# Patient Record
Sex: Male | Born: 1970 | Race: Black or African American | Hispanic: No | Marital: Single | Smoking: Current every day smoker
Health system: Southern US, Community
[De-identification: ages and names within clinical notes are randomized; demographics above are authoritative.]

## PROBLEM LIST (undated history)

## (undated) DIAGNOSIS — I10 Essential (primary) hypertension: Secondary | ICD-10-CM

---

## 2021-07-20 ENCOUNTER — Emergency Department (HOSPITAL_COMMUNITY): Payer: Self-pay

## 2021-07-20 ENCOUNTER — Encounter (HOSPITAL_COMMUNITY): Payer: Self-pay | Admitting: Emergency Medicine

## 2021-07-20 ENCOUNTER — Inpatient Hospital Stay (HOSPITAL_COMMUNITY)
Admission: EM | Admit: 2021-07-20 | Discharge: 2021-07-27 | DRG: 417 | Disposition: A | Discharge status: Home / Self Care | Payer: Self-pay | Attending: Family Medicine | Admitting: Family Medicine

## 2021-07-20 DIAGNOSIS — D573 Sickle-cell trait: Secondary | ICD-10-CM | POA: Diagnosis present

## 2021-07-20 DIAGNOSIS — D571 Sickle-cell disease without crisis: Secondary | ICD-10-CM | POA: Diagnosis present

## 2021-07-20 DIAGNOSIS — K661 Hemoperitoneum: Secondary | ICD-10-CM

## 2021-07-20 DIAGNOSIS — Z803 Family history of malignant neoplasm of breast: Secondary | ICD-10-CM

## 2021-07-20 DIAGNOSIS — I160 Hypertensive urgency: Secondary | ICD-10-CM | POA: Diagnosis present

## 2021-07-20 DIAGNOSIS — K7581 Nonalcoholic steatohepatitis (NASH): Secondary | ICD-10-CM | POA: Diagnosis present

## 2021-07-20 DIAGNOSIS — D62 Acute posthemorrhagic anemia: Secondary | ICD-10-CM | POA: Diagnosis present

## 2021-07-20 DIAGNOSIS — K810 Acute cholecystitis: Secondary | ICD-10-CM | POA: Diagnosis present

## 2021-07-20 DIAGNOSIS — K829 Disease of gallbladder, unspecified: Secondary | ICD-10-CM

## 2021-07-20 DIAGNOSIS — J9811 Atelectasis: Secondary | ICD-10-CM | POA: Diagnosis present

## 2021-07-20 DIAGNOSIS — I10 Essential (primary) hypertension: Secondary | ICD-10-CM | POA: Diagnosis present

## 2021-07-20 DIAGNOSIS — Z20822 Contact with and (suspected) exposure to covid-19: Secondary | ICD-10-CM | POA: Diagnosis present

## 2021-07-20 DIAGNOSIS — K8012 Calculus of gallbladder with acute and chronic cholecystitis without obstruction: Principal | ICD-10-CM | POA: Diagnosis present

## 2021-07-20 DIAGNOSIS — Z800 Family history of malignant neoplasm of digestive organs: Secondary | ICD-10-CM

## 2021-07-20 DIAGNOSIS — K9189 Other postprocedural complications and disorders of digestive system: Secondary | ICD-10-CM | POA: Diagnosis not present

## 2021-07-20 DIAGNOSIS — R739 Hyperglycemia, unspecified: Secondary | ICD-10-CM | POA: Diagnosis present

## 2021-07-20 DIAGNOSIS — E876 Hypokalemia: Secondary | ICD-10-CM | POA: Diagnosis present

## 2021-07-20 DIAGNOSIS — K82A2 Perforation of gallbladder in cholecystitis: Secondary | ICD-10-CM | POA: Diagnosis present

## 2021-07-20 DIAGNOSIS — K567 Ileus, unspecified: Secondary | ICD-10-CM | POA: Diagnosis not present

## 2021-07-20 DIAGNOSIS — R933 Abnormal findings on diagnostic imaging of other parts of digestive tract: Secondary | ICD-10-CM

## 2021-07-20 HISTORY — DX: Essential (primary) hypertension: I10

## 2021-07-20 LAB — CBC
HCT: 44.3 % (ref 39.0–52.0)
Hemoglobin: 14.7 g/dL (ref 13.0–17.0)
MCH: 28.6 pg (ref 26.0–34.0)
MCHC: 33.2 g/dL (ref 30.0–36.0)
MCV: 86.2 fL (ref 80.0–100.0)
Platelets: 290 10*3/uL (ref 150–400)
RBC: 5.14 MIL/uL (ref 4.22–5.81)
RDW: 16.5 % — ABNORMAL HIGH (ref 11.5–15.5)
WBC: 13.5 10*3/uL — ABNORMAL HIGH (ref 4.0–10.5)
nRBC: 0 % (ref 0.0–0.2)

## 2021-07-20 LAB — TYPE AND SCREEN
ABO/RH(D): A POS
Antibody Screen: NEGATIVE

## 2021-07-20 LAB — COMPREHENSIVE METABOLIC PANEL
ALT: 29 U/L (ref 0–44)
AST: 25 U/L (ref 15–41)
Albumin: 4.5 g/dL (ref 3.5–5.0)
Alkaline Phosphatase: 85 U/L (ref 38–126)
Anion gap: 14 (ref 5–15)
BUN: 19 mg/dL (ref 6–20)
CO2: 24 mmol/L (ref 22–32)
Calcium: 8.6 mg/dL — ABNORMAL LOW (ref 8.9–10.3)
Chloride: 98 mmol/L (ref 98–111)
Creatinine, Ser: 1.2 mg/dL (ref 0.61–1.24)
GFR, Estimated: >60 mL/min (ref >60)
Glucose, Bld: 157 mg/dL — ABNORMAL HIGH (ref 70–99)
Potassium: 3.7 mmol/L (ref 3.5–5.1)
Sodium: 136 mmol/L (ref 135–145)
Total Bilirubin: 1.3 mg/dL — ABNORMAL HIGH (ref 0.3–1.2)
Total Protein: 8.1 g/dL (ref 6.5–8.1)

## 2021-07-20 LAB — URINALYSIS, ROUTINE W REFLEX MICROSCOPIC
Bilirubin Urine: NEGATIVE
Glucose, UA: 150 mg/dL — AB
Ketones, ur: 5 mg/dL — AB
Leukocytes,Ua: NEGATIVE
Nitrite: NEGATIVE
Protein, ur: 30 mg/dL — AB
Specific Gravity, Urine: 1.017 (ref 1.005–1.030)
pH: 5 (ref 5.0–8.0)

## 2021-07-20 LAB — RESP PANEL BY RT-PCR (FLU A&B, COVID) ARPGX2
Influenza A by PCR: NEGATIVE
Influenza B by PCR: NEGATIVE
SARS Coronavirus 2 by RT PCR: NEGATIVE

## 2021-07-20 LAB — TROPONIN I (HIGH SENSITIVITY)
Troponin I (High Sensitivity): 8 ng/L (ref <18)
Troponin I (High Sensitivity): 7 ng/L (ref <18)

## 2021-07-20 LAB — LIPASE, BLOOD: Lipase: 25 U/L (ref 11–51)
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
## 2021-07-20 MED ORDER — HYDROMORPHONE HCL 1 MG/ML IJ SOLN
0.5000 mg | Freq: Once | INTRAMUSCULAR | Status: AC
  Administered 2021-07-20: 0.5 mg via INTRAVENOUS
  Filled 2021-07-20: qty 1

## 2021-07-20 MED ORDER — SODIUM CHLORIDE 0.9 % IV BOLUS
500.0000 mL | Freq: Once | INTRAVENOUS | Status: AC
  Administered 2021-07-20: 500 mL via INTRAVENOUS

## 2021-07-20 MED ORDER — IOHEXOL 350 MG/ML SOLN
80.0000 mL | Freq: Once | INTRAVENOUS | Status: AC | PRN
  Administered 2021-07-20: 80 mL via INTRAVENOUS

## 2021-07-20 MED ORDER — LABETALOL HCL 5 MG/ML IV SOLN
5.0000 mg | INTRAVENOUS | Status: DC | PRN
  Administered 2021-07-20 – 2021-07-24 (×5): 5 mg via INTRAVENOUS
  Filled 2021-07-20 (×5): qty 4

## 2021-07-20 MED ORDER — HYDROMORPHONE HCL 1 MG/ML IJ SOLN
0.5000 mg | INTRAMUSCULAR | Status: AC | PRN
  Administered 2021-07-20 (×3): 0.5 mg via INTRAVENOUS
  Filled 2021-07-20 (×3): qty 1

## 2021-07-20 MED ORDER — DEXTROSE IN LACTATED RINGERS 5 % IV SOLN: INTRAVENOUS | Status: DC

## 2021-07-20 MED ORDER — HYDROMORPHONE HCL 1 MG/ML IJ SOLN
0.5000 mg | INTRAMUSCULAR | Status: DC | PRN
  Administered 2021-07-21 – 2021-07-22 (×6): 1 mg via INTRAVENOUS
  Filled 2021-07-20 (×6): qty 1

## 2021-07-20 MED ORDER — SODIUM CHLORIDE 0.9 % IV SOLN
2.0000 g | Freq: Three times a day (TID) | INTRAVENOUS | Status: DC
  Administered 2021-07-20 – 2021-07-21 (×2): 2 g via INTRAVENOUS
  Filled 2021-07-20 (×2): qty 2

## 2021-07-20 MED ORDER — ONDANSETRON HCL 4 MG PO TABS: 4.0000 mg | ORAL_TABLET | Freq: Four times a day (QID) | ORAL | Status: DC | PRN

## 2021-07-20 MED ORDER — MORPHINE SULFATE (PF) 4 MG/ML IV SOLN
4.0000 mg | Freq: Once | INTRAVENOUS | Status: AC
  Administered 2021-07-20: 4 mg via INTRAVENOUS
  Filled 2021-07-20: qty 1

## 2021-07-20 MED ORDER — PIPERACILLIN-TAZOBACTAM 3.375 G IVPB 30 MIN
3.3750 g | Freq: Once | INTRAVENOUS | Status: AC
  Administered 2021-07-20: 3.375 g via INTRAVENOUS
  Filled 2021-07-20: qty 50

## 2021-07-20 MED ORDER — ONDANSETRON HCL 4 MG/2ML IJ SOLN
4.0000 mg | Freq: Once | INTRAMUSCULAR | Status: AC
Start: 2021-07-20 — End: 2021-07-20
  Administered 2021-07-20: 4 mg via INTRAVENOUS
  Filled 2021-07-20: qty 2

## 2021-07-20 MED ORDER — ONDANSETRON HCL 4 MG/2ML IJ SOLN
4.0000 mg | Freq: Four times a day (QID) | INTRAMUSCULAR | Status: DC | PRN
  Administered 2021-07-21: 4 mg via INTRAVENOUS

## 2021-07-20 MED ORDER — METRONIDAZOLE 500 MG/100ML IV SOLN
500.0000 mg | Freq: Two times a day (BID) | INTRAVENOUS | Status: DC
  Administered 2021-07-20 – 2021-07-21 (×2): 500 mg via INTRAVENOUS
  Filled 2021-07-20 (×2): qty 100

## 2021-07-20 MED ORDER — ONDANSETRON HCL 4 MG/2ML IJ SOLN
4.0000 mg | Freq: Once | INTRAMUSCULAR | Status: AC
  Administered 2021-07-20: 4 mg via INTRAVENOUS

## 2021-07-20 MED ORDER — SODIUM CHLORIDE 0.9 % IV BOLUS
1000.0000 mL | Freq: Once | INTRAVENOUS | Status: AC
  Administered 2021-07-20: 1000 mL via INTRAVENOUS
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
## 2021-07-20 NOTE — ED Notes (Addendum)
Pt requesting water or ice chips. Notified pt that he is NPO at this time. Provided pt with mouth swabs.

## 2021-07-20 NOTE — ED Notes (Signed)
General sx at bedside 

## 2021-07-20 NOTE — ED Notes (Signed)
Pt encouraged to prive urine specimen per MD order.

## 2021-07-20 NOTE — ED Provider Notes (Addendum)
Signout from [employer] at shift change.  Patient with abdominal pain, currently awaiting labs and CT imaging.  After completion of CT, I spoke with radiologist with findings as described.  Discussed case with Dr. Donell Beers general surgery.  General surgery requests hospitalist admission due to uncontrolled hypertension in the ED.  Patient updated.  His pain continues to be poorly controlled.  Additional pain medications ordered.  Ordered IV Zosyn.  Patient n.p.o. since early this morning.  COVID testing is underway.  BP (!) 172/155   Pulse 93   Temp 97.6 F (36.4 C) (Oral)   Resp 20   SpO2 99%    8:03 PM Spoke with Dr. Mikeal Hawthorne who will see patient.    CRITICAL CARE Performed by: Renne Crigler PA-C Total critical care time: 35 minutes Critical care time was exclusive of separately billable procedures and treating other patients. Critical care was necessary to treat or prevent imminent or life-threatening deterioration. Critical care was time spent personally by me on the following activities: development of treatment plan with patient and/or surrogate as well as nursing, discussions with consultants, evaluation of patient's response to treatment, examination of patient, obtaining history from patient or surrogate, ordering and performing treatments and interventions, ordering and review of laboratory studies, ordering and review of radiographic studies, pulse oximetry and re-evaluation of patient's condition.       Renne Crigler, PA-C 07/20/21 2004    Lorre Nick, MD 07/21/21 [id number]

## 2021-07-20 NOTE — ED Notes (Signed)
Pt transported to CT ?
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
## 2021-07-20 NOTE — ED Provider Notes (Signed)
[facility]-EMERGENCY DEPT Provider Note   CSN: [id number] Arrival date & time: 07/20/21  1256     History Chief Complaint  Patient presents with   Abdominal Pain    Eugene Brooks is a 50 y.o. male.  HPI  Patient presents with generalized abdominal pain that started last night.  Pain is constant, associated with nausea and multiple episodes of emesis.  States it started after eating a pizza last night and laying down flat.  The vomiting initially made the abdominal pain improved, but he continued to vomit into the night and early this morning.  He has had over 12 episodes of emesis total.  He states he has been feeling constipated, but he has been passing gas and had a small bowel movement this morning.  No previous abdominal surgeries.    He also endorses pain to the chest.  This started this morning after multiple episodes of emesis, it was associated with dizziness and diaphoresis.  It lasted for minutes and resolved on its own.  No previous history of MI or stroke or blood clot.  He denies any current chest pain, no shortness of breath.  History reviewed. No pertinent past medical history.  There are no problems to display for this patient.   History reviewed. No pertinent surgical history.     No family history on file.     Home Medications Prior to Admission medications   Not on File    Allergies    Patient has no allergy information on record.  Review of Systems   Review of Systems  Constitutional:  Positive for diaphoresis. Negative for fatigue and fever.  HENT:  Negative for congestion.   Respiratory:  Negative for cough and shortness of breath.   Cardiovascular:  Positive for chest pain.  Gastrointestinal:  Positive for abdominal pain, constipation, nausea and vomiting.  Genitourinary:  Negative for dysuria.  Skin:  Negative for color change.   Physical Exam Updated Vital Signs BP 138/88 (BP Location: Left Arm)   Pulse 82   Temp  97.6 F (36.4 C) (Oral)   Resp 17   SpO2 98%   Physical Exam Vitals and nursing note reviewed. Exam conducted with a chaperone present.  Constitutional:      Appearance: Normal appearance.  HENT:     Head: Normocephalic and atraumatic.  Eyes:     General: No scleral icterus.       Right eye: No discharge.        Left eye: No discharge.     Extraocular Movements: Extraocular movements intact.     Pupils: Pupils are equal, round, and reactive to light.  Cardiovascular:     Rate and Rhythm: Normal rate and regular rhythm.     Pulses: Normal pulses.     Heart sounds: Normal heart sounds. No murmur heard.   No friction rub. No gallop.  Pulmonary:     Effort: Pulmonary effort is normal. No respiratory distress.     Breath sounds: Normal breath sounds.  Abdominal:     General: Abdomen is flat. Bowel sounds are normal. There is no distension.     Palpations: Abdomen is soft.     Tenderness: There is generalized abdominal tenderness.     Comments: Generalized abdominal tenderness without any guarding or rigidity.  Skin:    General: Skin is warm and dry.     Coloration: Skin is not jaundiced.  Neurological:     Mental Status: He is alert. Mental status is  at baseline.     Coordination: Coordination normal.    ED Results / Procedures / Treatments   Labs (all labs ordered are listed, but only abnormal results are displayed) Labs Reviewed  LIPASE, BLOOD  COMPREHENSIVE METABOLIC PANEL  CBC  URINALYSIS, ROUTINE W REFLEX MICROSCOPIC  TROPONIN I (HIGH SENSITIVITY)    EKG None  Radiology No results found.  Procedures Procedures   Medications Ordered in ED Medications  sodium chloride 0.9 % bolus 1,000 mL (has no administration in time range)  ondansetron (ZOFRAN) injection 4 mg (has no administration in time range)  morphine 4 MG/ML injection 4 mg (has no administration in time range)    ED Course  I have reviewed the triage vital signs and the nursing  notes.  Pertinent labs & imaging results that were available during my care of the patient were reviewed by me and considered in my medical decision making (see chart for details).  Clinical Course as of 07/20/21 1532  Tue Jul 20, 2021  1350 CBC(!) Leukocytosis, no anemia [HS]  1431 Patient reports the pain is now right lower quadrant and is significantly worse than it was when I first saw him.  He states is because he was just given morphine [HS]    Clinical Course User Index [HS] Theron Arista, PA-C   MDM Rules/Calculators/A&P                           Patient vitals are stable, he is nontoxic-appearing.  Differential includes gastritis, GERD, SBO, ACS, cholecystitis, appendicitis, colitis, pancreatitis.   He does have abdominal tenderness diffusely, no localization or peritoneal signs or guarding.  Will order DG abdomen just to assess for the amount of constipation and signs of small bowel obstruction.  I do not suspect that the pain is due to any localized organ or group.  Patient chest pain I suspect is likely GERD and independent of ACS.  He has no risk factors, will check an initial troponin and EKG given his episode of diaphoresis and dizziness and age.  Lab work is pending at shift change, imaging is still pending.  Unfortunately, work-up has not been completed enough for me to be able to speculate on disposition.  Care signed out to Renne Crigler, PA-C who is aware of the work-up and history thus far.  Further disposition is pending at the shift change, please see his note for more information.  Final Clinical Impression(s) / ED Diagnoses Final diagnoses:  None    Rx / DC Orders ED Discharge Orders     None        Theron Arista, Cordelia Poche 07/20/21 1533    Kommor, Wyn Forster, MD 07/20/21 2054

## 2021-07-20 NOTE — ED Provider Notes (Signed)
Emergency Medicine Provider Triage Evaluation Note  Eugene Brooks , a 50 y.o. male  was evaluated in triage.  Pt complains of abdominal pain that started last night.  Generalized, constant, associated symptoms include nausea and 7 episodes of emesis.  States he thinks he is constipated, only passed a small amount of stool when he tried to have a BM.  Has been passing gas.  Review of Systems  Positive: Abdominal pain, nausea, vomiting, constipation Negative: Fever, hematemesis, melena  Physical Exam  BP 138/88 (BP Location: Left Arm)   Pulse 82   Temp 97.6 F (36.4 C) (Oral)   Resp 17   SpO2 98%  Gen:   Awake, no distress   Resp:  Normal effort  MSK:   Moves extremities without difficulty  Other:  Generalized abdominal tenderness  Medical Decision Making  Medically screening exam initiated at 1:10 PM.  Appropriate orders placed.  Ikenna Ohms was informed that the remainder of the evaluation will be completed by another provider, this initial triage assessment does not replace that evaluation, and the importance of remaining in the ED until their evaluation is complete.  Abdominal pain   Cherly Anderson, PA-C 07/20/21 1311    Derwood Kaplan, MD 07/20/21 1723

## 2021-07-20 NOTE — ED Triage Notes (Signed)
Per EMS abdominal pain , vomiting and cramping since last night-occurred after eating take out meal

## 2021-07-21 ENCOUNTER — Inpatient Hospital Stay: Admit: 2021-07-21 | Payer: Self-pay | Admitting: Surgery

## 2021-07-21 ENCOUNTER — Other Ambulatory Visit: Payer: Self-pay

## 2021-07-21 ENCOUNTER — Inpatient Hospital Stay (HOSPITAL_COMMUNITY): Payer: Self-pay

## 2021-07-21 ENCOUNTER — Inpatient Hospital Stay (HOSPITAL_COMMUNITY): Payer: Self-pay | Admitting: Anesthesiology

## 2021-07-21 ENCOUNTER — Encounter (HOSPITAL_COMMUNITY): Payer: Self-pay | Admitting: Internal Medicine

## 2021-07-21 ENCOUNTER — Encounter (HOSPITAL_COMMUNITY)
Admission: EM | Disposition: A | Discharge status: Home / Self Care | Payer: Self-pay | Source: Home / Self Care | Attending: Family Medicine

## 2021-07-21 DIAGNOSIS — K810 Acute cholecystitis: Secondary | ICD-10-CM

## 2021-07-21 DIAGNOSIS — I10 Essential (primary) hypertension: Secondary | ICD-10-CM

## 2021-07-21 HISTORY — PX: LAPAROTOMY: SHX154

## 2021-07-21 LAB — BASIC METABOLIC PANEL
Anion gap: 9 (ref 5–15)
BUN: 17 mg/dL (ref 6–20)
CO2: 28 mmol/L (ref 22–32)
Calcium: 8.6 mg/dL — ABNORMAL LOW (ref 8.9–10.3)
Chloride: 99 mmol/L (ref 98–111)
Creatinine, Ser: 0.98 mg/dL (ref 0.61–1.24)
GFR, Estimated: >60 mL/min (ref >60)
Glucose, Bld: 134 mg/dL — ABNORMAL HIGH (ref 70–99)
Potassium: 3.8 mmol/L (ref 3.5–5.1)
Sodium: 136 mmol/L (ref 135–145)

## 2021-07-21 LAB — CBC
HCT: 38.7 % — ABNORMAL LOW (ref 39.0–52.0)
HCT: 37.5 % — ABNORMAL LOW (ref 39.0–52.0)
Hemoglobin: 12.8 g/dL — ABNORMAL LOW (ref 13.0–17.0)
Hemoglobin: 12.4 g/dL — ABNORMAL LOW (ref 13.0–17.0)
MCH: 28 pg (ref 26.0–34.0)
MCH: 28.4 pg (ref 26.0–34.0)
MCHC: 33.1 g/dL (ref 30.0–36.0)
MCHC: 33.1 g/dL (ref 30.0–36.0)
MCV: 84.7 fL (ref 80.0–100.0)
MCV: 86 fL (ref 80.0–100.0)
Platelets: 328 10*3/uL (ref 150–400)
Platelets: 307 10*3/uL (ref 150–400)
RBC: 4.57 MIL/uL (ref 4.22–5.81)
RBC: 4.36 MIL/uL (ref 4.22–5.81)
RDW: 15.9 % — ABNORMAL HIGH (ref 11.5–15.5)
RDW: 15.8 % — ABNORMAL HIGH (ref 11.5–15.5)
WBC: 12.3 10*3/uL — ABNORMAL HIGH (ref 4.0–10.5)
WBC: 11 10*3/uL — ABNORMAL HIGH (ref 4.0–10.5)
nRBC: 0 % (ref 0.0–0.2)
nRBC: 0 % (ref 0.0–0.2)

## 2021-07-21 LAB — COMPREHENSIVE METABOLIC PANEL
ALT: 23 U/L (ref 0–44)
AST: 22 U/L (ref 15–41)
Albumin: 4.2 g/dL (ref 3.5–5.0)
Alkaline Phosphatase: 74 U/L (ref 38–126)
Anion gap: 10 (ref 5–15)
BUN: 16 mg/dL (ref 6–20)
CO2: 23 mmol/L (ref 22–32)
Calcium: 8.6 mg/dL — ABNORMAL LOW (ref 8.9–10.3)
Chloride: 102 mmol/L (ref 98–111)
Creatinine, Ser: 1.02 mg/dL (ref 0.61–1.24)
GFR, Estimated: >60 mL/min (ref >60)
Glucose, Bld: 120 mg/dL — ABNORMAL HIGH (ref 70–99)
Potassium: 4.1 mmol/L (ref 3.5–5.1)
Sodium: 135 mmol/L (ref 135–145)
Total Bilirubin: 1.4 mg/dL — ABNORMAL HIGH (ref 0.3–1.2)
Total Protein: 7.7 g/dL (ref 6.5–8.1)

## 2021-07-21 LAB — ABO/RH: ABO/RH(D): A POS

## 2021-07-21 LAB — HIV ANTIBODY (ROUTINE TESTING W REFLEX): HIV Screen 4th Generation wRfx: NONREACTIVE

## 2021-07-21 IMAGING — MR MR ABDOMEN WO/W CM MRCP
17 of 21 series · 39 of 48 positions shown · IV contrast (gadavist)
Comparison: CT scan [DATE]

CLINICAL DATA: Cholelithiasis and severe abdominal pain. Abnormal
CT scan.

EXAM:
MRI ABDOMEN WITHOUT AND WITH CONTRAST (INCLUDING MRCP)
TECHNIQUE: Multiplanar multisequence MR imaging of the abdomen was performed
both before and after the administration of intravenous contrast.
Heavily T2-weighted images of the biliary and pancreatic ducts were
obtained, and three-dimensional MRCP images were rendered by post
processing.
CONTRAST:  8mL GADAVIST GADOBUTROL 1 MMOL/ML IV SOLN

[Series 2: DWI · axial · 6.0mm · 1.49mm/px · z∈[-84,+196]mm · 2 of 80 slices shown (1 of 2)]
[im 1/80]
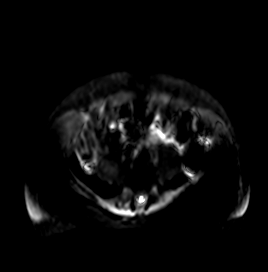
[im 80/80]
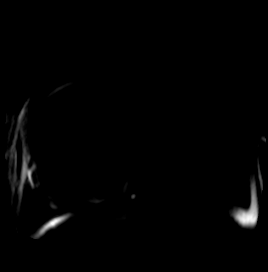

[Series 3: DWI · axial · 6.0mm · 1.49mm/px · 1 of 40 slices shown (2 of 2)]
[im 1/40]
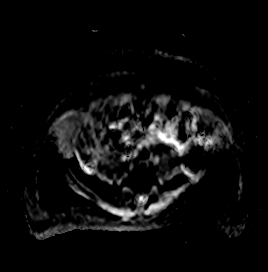

[Series 5: T2 fat-sat · axial · 6.0mm · 1.56mm/px · 1 of 42 slices shown]
[im 1/42]
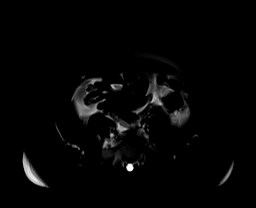

[Series 6: cor_3d_spc_trig · coronal · 1.0mm · 0.49mm/px · 3 of 80 slices shown]
[im 1/80]
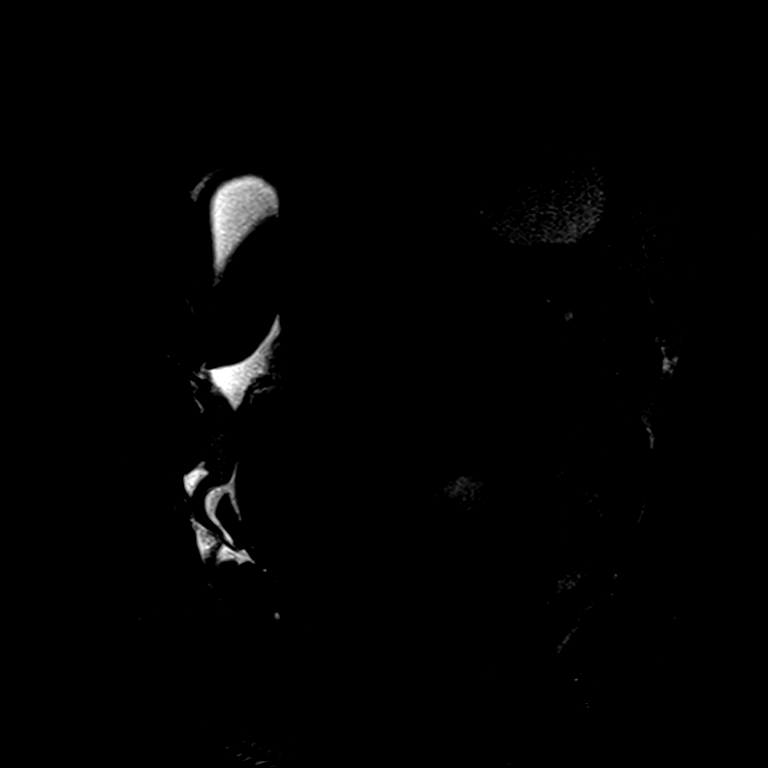
[im 40/80]
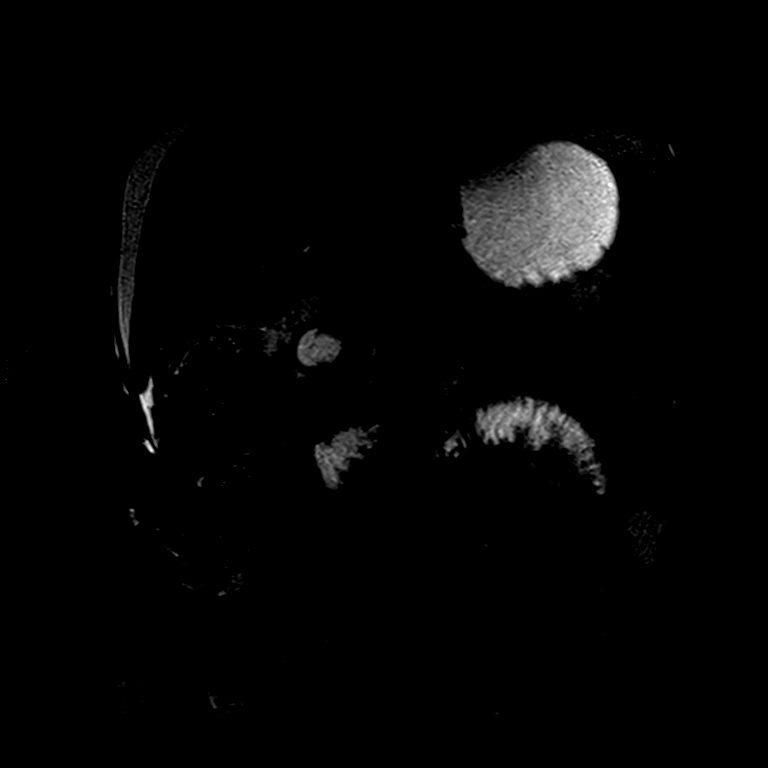
[im 80/80]
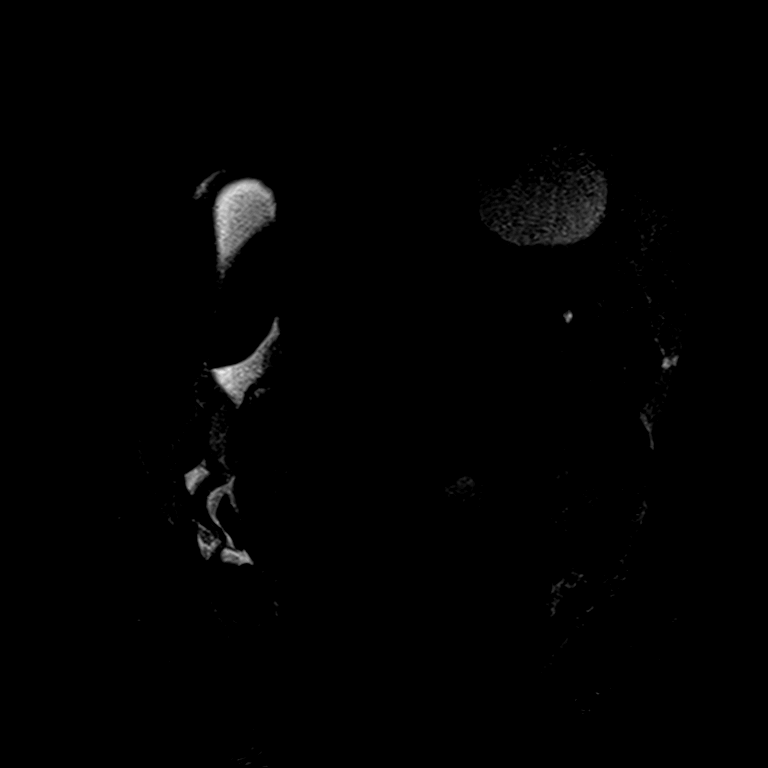

[Series 8: T2 · coronal · 6.0mm · 1.48mm/px · 1 of 33 slices shown (1 of 2)]
[im 1/33]
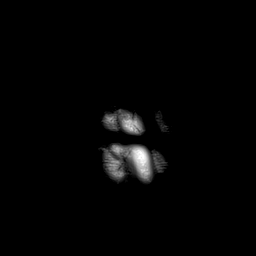

[Series 11: T1 · axial · 3.0mm · 1.25mm/px · z∈[-72,+189]mm · 3 of 88 slices shown (1 of 2)]
[im 1/88]
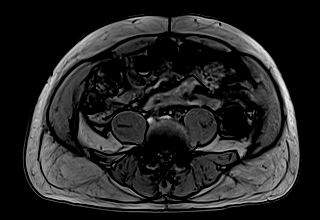
[im 44/88]
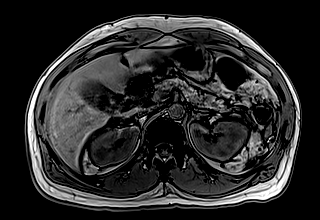
[im 88/88]
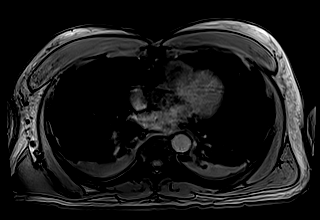

[Series 12: T1 · axial · 3.0mm · 1.25mm/px · z∈[-72,+189]mm · 3 of 88 slices shown (2 of 2)]
[im 1/88]
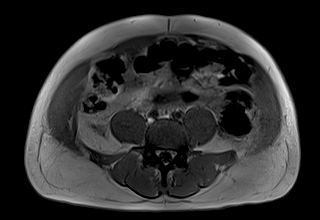
[im 44/88]
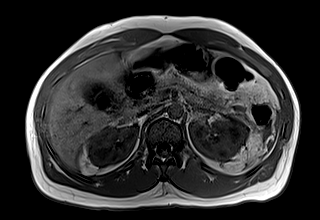
[im 88/88]
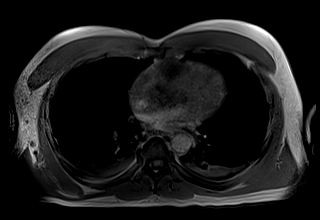

[Series 13: cor obl thk · sagittal · 50.0mm · 0.78mm/px · 1 of 9 slices shown]
[im 1/9]
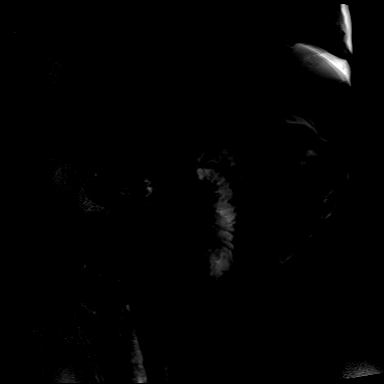

[Series 15: T2 · axial · 6.0mm · 1.56mm/px · 1 of 42 slices shown (2 of 2)]
[im 1/42]
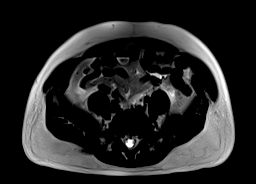

[Series 17: T1 dynamic · axial · 3.0mm · 1.25mm/px · z∈[-95,+190]mm · 3 of 96 slices shown (1 of 6)]
[im 1/96]
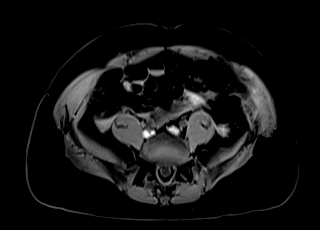
[im 48/96]
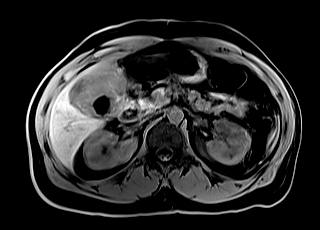
[im 96/96]
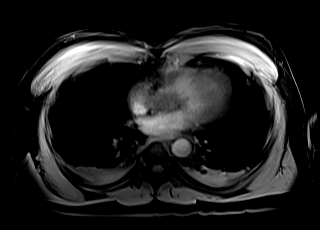

[Series 20: T1 dynamic · axial · 3.0mm · 1.25mm/px · z∈[-95,+190]mm · 3 of 96 slices shown (2 of 6)]
[im 1/96]
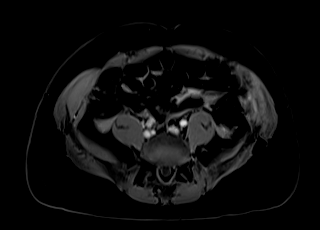
[im 48/96]
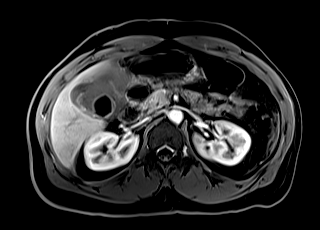
[im 96/96]
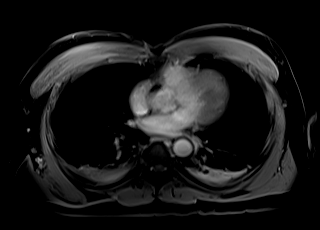

[Series 22: T1 dynamic · axial · 3.0mm · 1.25mm/px · z∈[-95,+190]mm · 3 of 96 slices shown (3 of 6)]
[im 1/96]
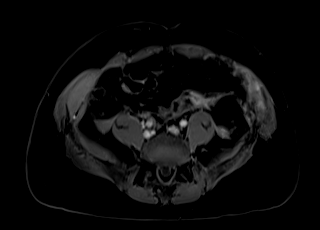
[im 48/96]
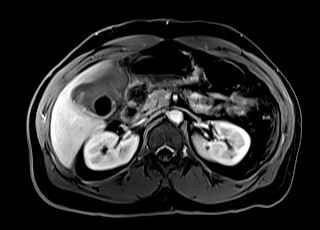
[im 96/96]
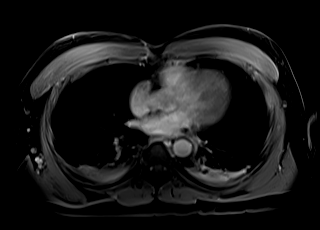

[Series 24: T1 dynamic · axial · 3.0mm · 1.25mm/px · z∈[-95,+190]mm · 3 of 96 slices shown (4 of 6)]
[im 1/96]
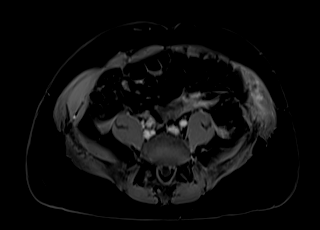
[im 48/96]
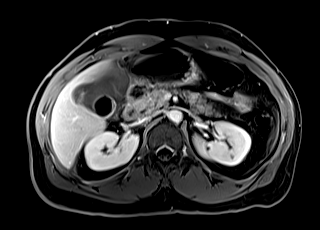
[im 96/96]
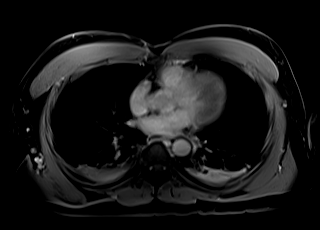

[Series 26: T1 dynamic · coronal · 5.0mm · 1.41mm/px · 2 of 52 slices shown (5 of 6)]
[im 1/52]
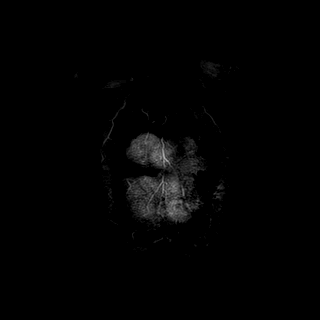
[im 52/52]
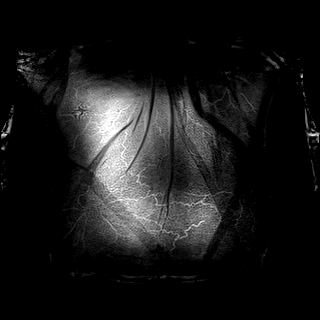

[Series 28: T1 dynamic · axial · 3.0mm · 1.25mm/px · z∈[-95,+190]mm · 3 of 96 slices shown (6 of 6)]
[im 1/96]
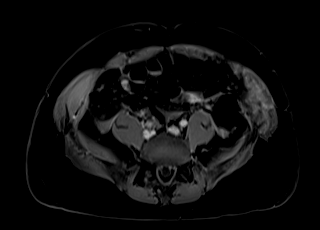
[im 48/96]
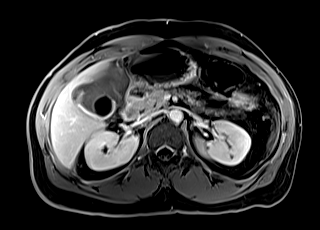
[im 96/96]
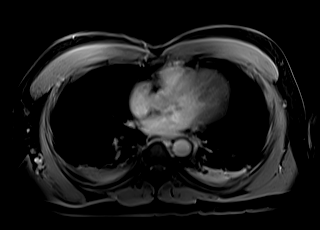

[Series 101: 20 sec sub · axial · 3.0mm · 1.25mm/px · z∈[-95,+190]mm · 3 of 96 slices shown]
[im 1/96]
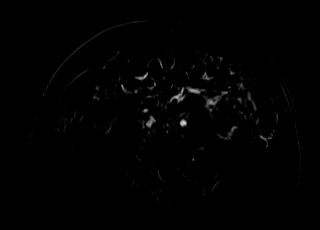
[im 48/96]
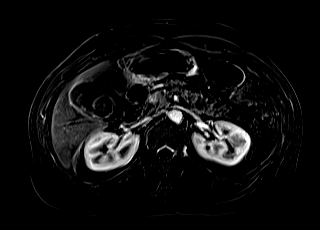
[im 96/96]
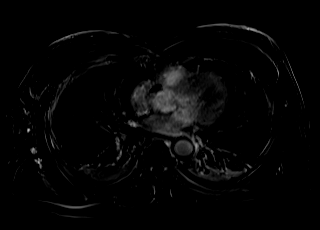

[Series 102: 45 sec [id number]_1 · axial · 3.0mm · 1.25mm/px · z∈[-95,+190]mm · 3 of 96 slices shown]
[im 1/96]
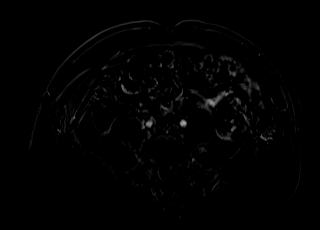
[im 48/96]
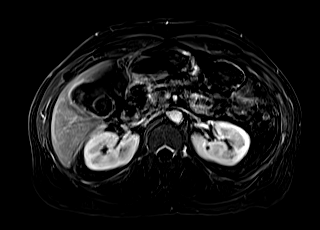
[im 96/96]
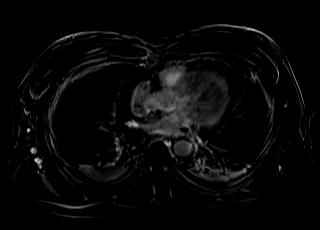

[39 of 48 positions shown; findings below may reference images not displayed]

FINDINGS: Lower chest: Small bilateral pleural effusions and bibasilar
atelectasis.

Hepatobiliary: No hepatic lesions are identified. No intrahepatic
biliary dilatation. Normal caliber and course of the common bile
duct.

There is a large "mass" in the gallbladder fossa. This surrounds a 2
cm gallstone. This demonstrates areas of slight increased T1 signal
intensity and low T2 signal intensity most consistent with hematoma.
This extends down below the gallbladder and into the omentum portion
the colon medially and slightly inferiorly. There is also
surrounding perihepatic fluid and areas of patchy fluid throughout
the abdomen and visualized pelvis. Findings are most consistent with
ruptured cholecystitis with hematoma and bile leak. I do not see any
evidence to suggest this is a gallbladder carcinoma.

The portal and hepatic veins are patent.

Pancreas:  No mass, inflammation or ductal dilatation.

Spleen: The spleen is small and demonstrates low T1 and T2 signal
intensity with possible MUMTAKHIMAH bodies possibly due to prior
granulomatous process.

Adrenals/Urinary Tract: The adrenal glands and kidneys are
unremarkable except for small scattered renal cysts.

Stomach/Bowel: The stomach, duodenum, visualized small bowel and
visualized colon are grossly normal.

Vascular/Lymphatic: The aorta and branch vessels are patent. The
major venous structures are patent. The IVC is slit-like which can
be seen with volume loss and dehydration.

Other:  Scattered fluid and hemoperitoneum down into the pelvis.

Musculoskeletal: No significant bony findings.
IMPRESSION: 1. MR findings confirm CT findings of ruptured cholecystitis with
large hematoma in the right upper quadrant along with some
combination of free fluid and hematoma throughout the abdomen and
pelvis. No findings suspicious for gallbladder carcinoma.
2. Normal caliber and course of the common bile duct.
3. Small spleen with low T1 and T2 signal intensity with probable
MUMTAKHIMAH bodies possibly due to prior granulomatous process.
4. Small bilateral pleural effusions and bibasilar atelectasis.

## 2021-07-21 IMAGING — MR MR 3D RECON AT SCANNER
16 of 20 series · 39 of 48 positions shown · IV contrast (gadavist)
Comparison: CT scan [DATE]

CLINICAL DATA: Cholelithiasis and severe abdominal pain. Abnormal
CT scan.

EXAM:
MRI ABDOMEN WITHOUT AND WITH CONTRAST (INCLUDING MRCP)
TECHNIQUE: Multiplanar multisequence MR imaging of the abdomen was performed
both before and after the administration of intravenous contrast.
Heavily T2-weighted images of the biliary and pancreatic ducts were
obtained, and three-dimensional MRCP images were rendered by post
processing.
CONTRAST:  8mL GADAVIST GADOBUTROL 1 MMOL/ML IV SOLN

[Series 3: DWI · axial · 6.0mm · 1.49mm/px · z∈[-84,+196]mm · 2 of 40 slices shown]
[im 1/40]
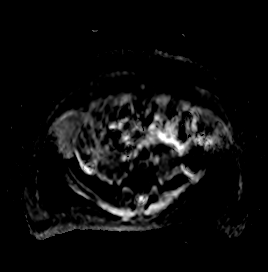
[im 40/40]
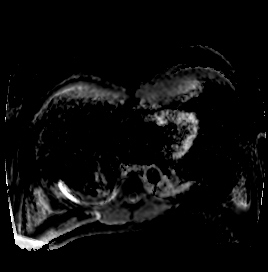

[Series 5: T2 fat-sat · axial · 6.0mm · 1.56mm/px · z∈[-114,+181]mm · 2 of 42 slices shown]
[im 1/42]
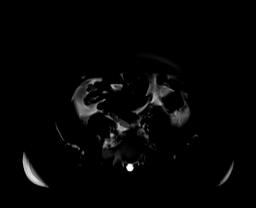
[im 42/42]
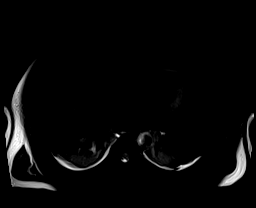

[Series 6: cor_3d_spc_trig · coronal · 1.0mm · 0.49mm/px · 3 of 80 slices shown]
[im 1/80]
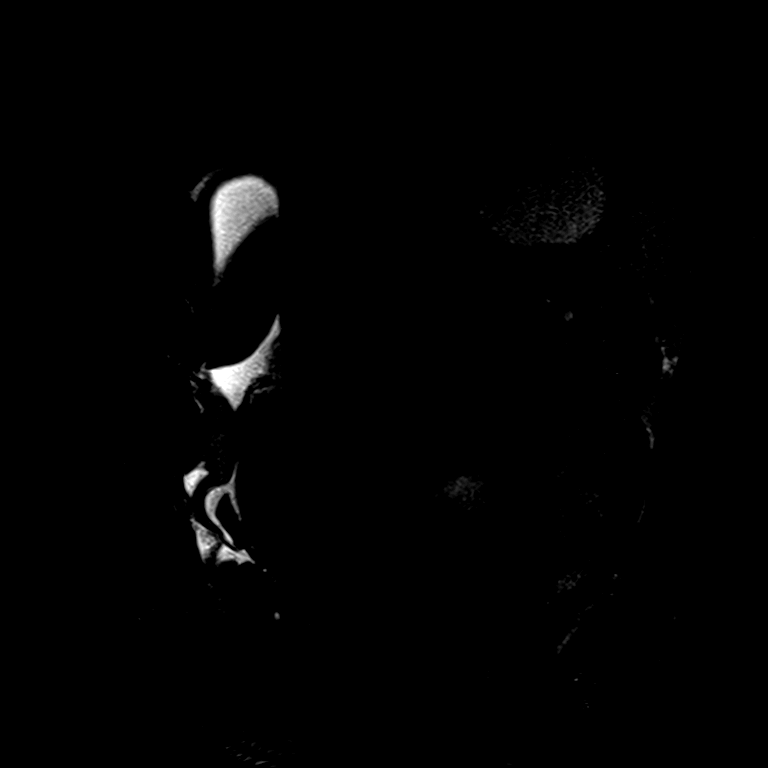
[im 40/80]
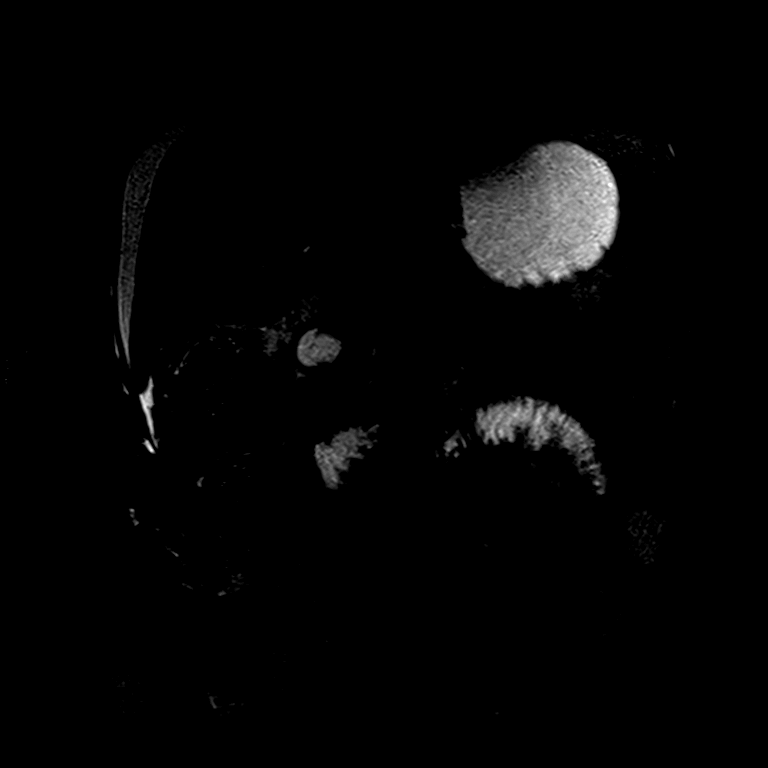
[im 80/80]
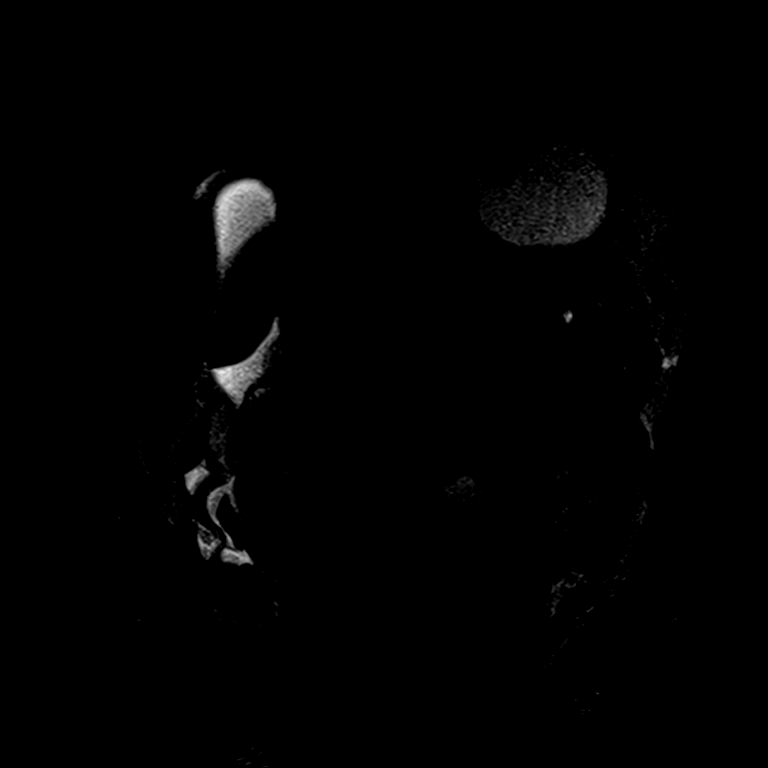

[Series 8: T2 · coronal · 6.0mm · 1.48mm/px · 1 of 33 slices shown (1 of 2)]
[im 1/33]
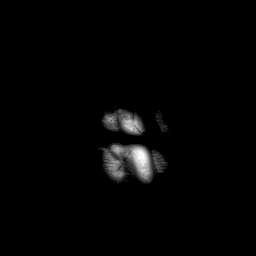

[Series 11: T1 · axial · 3.0mm · 1.25mm/px · z∈[-72,+189]mm · 3 of 88 slices shown (1 of 2)]
[im 1/88]
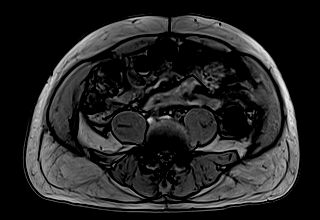
[im 44/88]
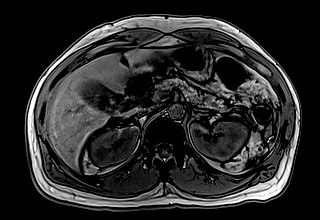
[im 88/88]
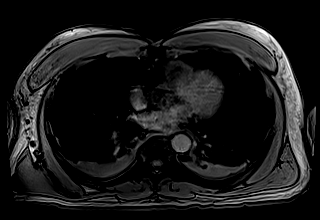

[Series 12: T1 · axial · 3.0mm · 1.25mm/px · z∈[-72,+189]mm · 3 of 88 slices shown (2 of 2)]
[im 1/88]
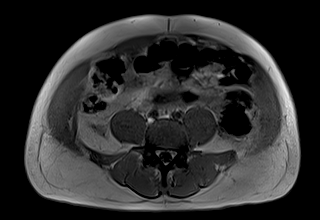
[im 44/88]
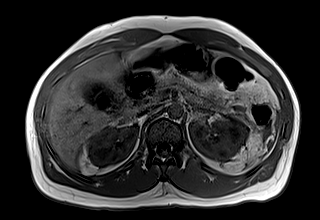
[im 88/88]
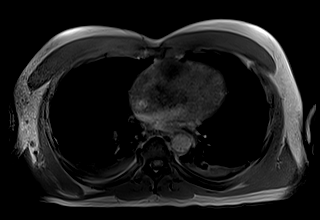

[Series 13: cor obl thk · sagittal · 50.0mm · 0.78mm/px · 1 of 9 slices shown]
[im 1/9]
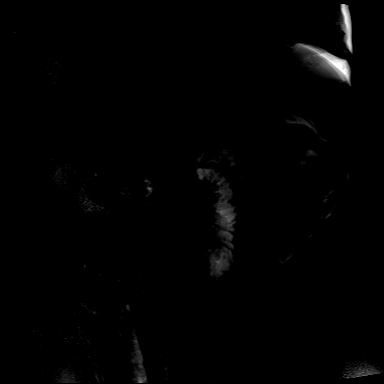

[Series 15: T2 · axial · 6.0mm · 1.56mm/px · 1 of 42 slices shown (2 of 2)]
[im 1/42]
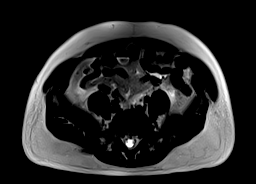

[Series 17: T1 dynamic · axial · 3.0mm · 1.25mm/px · z∈[-95,+190]mm · 3 of 96 slices shown (1 of 6)]
[im 1/96]
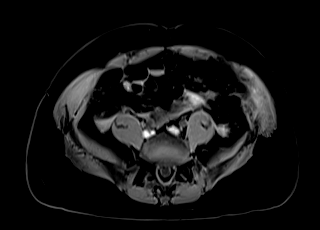
[im 48/96]
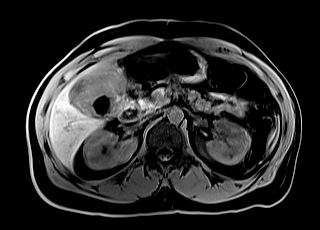
[im 96/96]
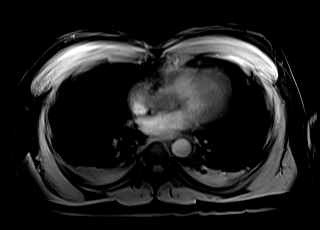

[Series 20: T1 dynamic · axial · 3.0mm · 1.25mm/px · z∈[-95,+190]mm · 3 of 96 slices shown (2 of 6)]
[im 1/96]
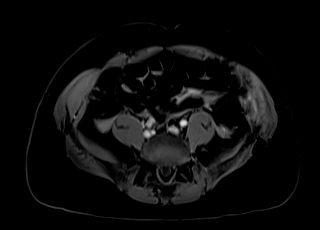
[im 48/96]
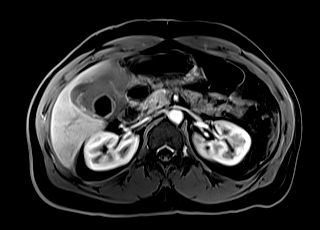
[im 96/96]
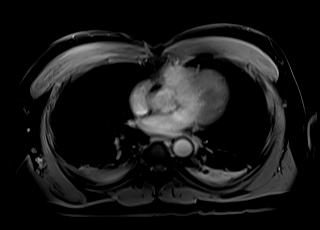

[Series 22: T1 dynamic · axial · 3.0mm · 1.25mm/px · z∈[-95,+190]mm · 3 of 96 slices shown (3 of 6)]
[im 1/96]
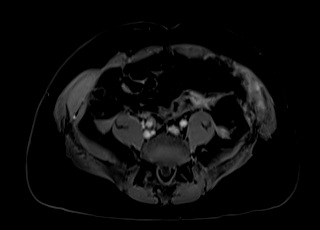
[im 48/96]
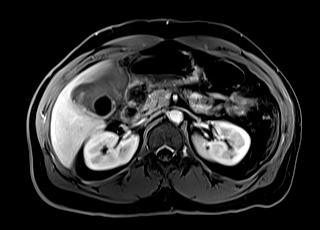
[im 96/96]
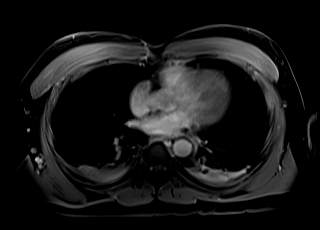

[Series 24: T1 dynamic · axial · 3.0mm · 1.25mm/px · z∈[-95,+190]mm · 3 of 96 slices shown (4 of 6)]
[im 1/96]
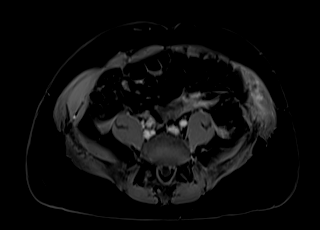
[im 48/96]
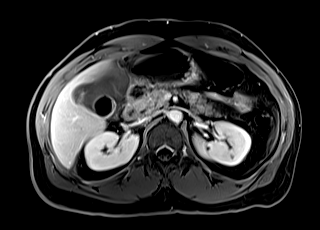
[im 96/96]
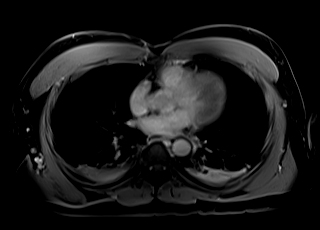

[Series 26: T1 dynamic · coronal · 5.0mm · 1.41mm/px · 2 of 52 slices shown (5 of 6)]
[im 1/52]
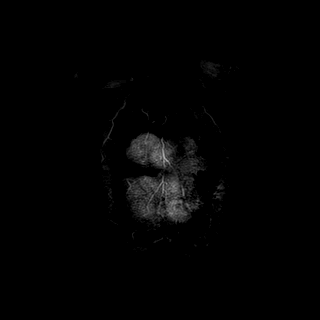
[im 52/52]
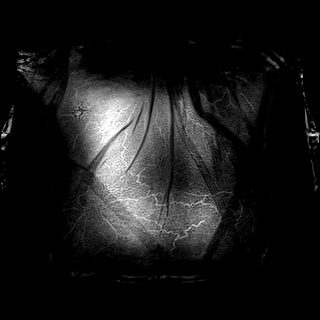

[Series 28: T1 dynamic · axial · 3.0mm · 1.25mm/px · z∈[-95,+190]mm · 3 of 96 slices shown (6 of 6)]
[im 1/96]
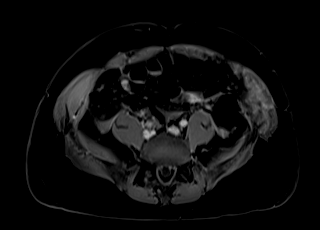
[im 48/96]
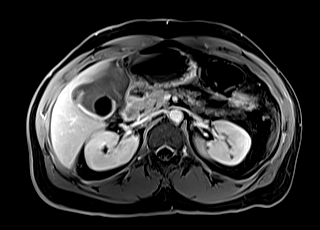
[im 96/96]
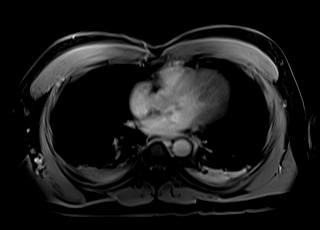

[Series 101: 20 sec sub · axial · 3.0mm · 1.25mm/px · z∈[-95,+190]mm · 3 of 96 slices shown]
[im 1/96]
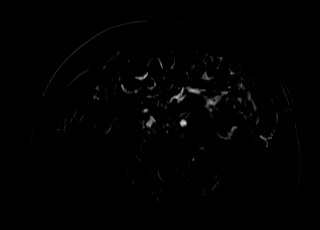
[im 48/96]
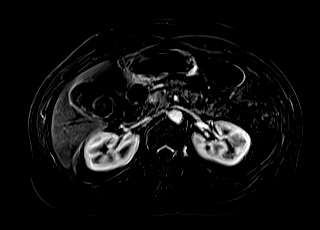
[im 96/96]
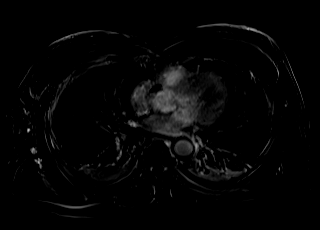

[Series 102: 45 sec [id number] · axial · 3.0mm · 1.25mm/px · z∈[-95,+190]mm · 3 of 96 slices shown]
[im 1/96]
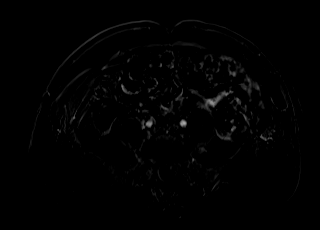
[im 48/96]
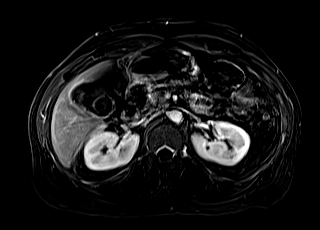
[im 96/96]
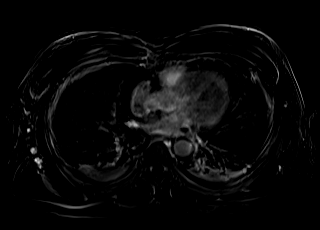

[39 of 48 positions shown; findings below may reference images not displayed]

FINDINGS: Lower chest: Small bilateral pleural effusions and bibasilar
atelectasis.

Hepatobiliary: No hepatic lesions are identified. No intrahepatic
biliary dilatation. Normal caliber and course of the common bile
duct.

There is a large "mass" in the gallbladder fossa. This surrounds a 2
cm gallstone. This demonstrates areas of slight increased T1 signal
intensity and low T2 signal intensity most consistent with hematoma.
This extends down below the gallbladder and into the omentum portion
the colon medially and slightly inferiorly. There is also
surrounding perihepatic fluid and areas of patchy fluid throughout
the abdomen and visualized pelvis. Findings are most consistent with
ruptured cholecystitis with hematoma and bile leak. I do not see any
evidence to suggest this is a gallbladder carcinoma.

The portal and hepatic veins are patent.

Pancreas:  No mass, inflammation or ductal dilatation.

Spleen: The spleen is small and demonstrates low T1 and T2 signal
intensity with possible MUMTAKHIMAH bodies possibly due to prior
granulomatous process.

Adrenals/Urinary Tract: The adrenal glands and kidneys are
unremarkable except for small scattered renal cysts.

Stomach/Bowel: The stomach, duodenum, visualized small bowel and
visualized colon are grossly normal.

Vascular/Lymphatic: The aorta and branch vessels are patent. The
major venous structures are patent. The IVC is slit-like which can
be seen with volume loss and dehydration.

Other:  Scattered fluid and hemoperitoneum down into the pelvis.

Musculoskeletal: No significant bony findings.
IMPRESSION: 1. MR findings confirm CT findings of ruptured cholecystitis with
large hematoma in the right upper quadrant along with some
combination of free fluid and hematoma throughout the abdomen and
pelvis. No findings suspicious for gallbladder carcinoma.
2. Normal caliber and course of the common bile duct.
3. Small spleen with low T1 and T2 signal intensity with probable
MUMTAKHIMAH bodies possibly due to prior granulomatous process.
4. Small bilateral pleural effusions and bibasilar atelectasis.

## 2021-07-21 SURGERY — LAPAROTOMY, EXPLORATORY
Anesthesia: General | Site: Abdomen

## 2021-07-21 MED ORDER — BUPIVACAINE LIPOSOME 1.3 % IJ SUSP
20.0000 mL | Freq: Once | INTRAMUSCULAR | Status: AC
  Administered 2021-07-21: 20 mL
  Filled 2021-07-21: qty 20

## 2021-07-21 MED ORDER — MIDAZOLAM HCL 2 MG/2ML IJ SOLN
INTRAMUSCULAR | Status: DC | PRN
  Administered 2021-07-21: 2 mg via INTRAVENOUS

## 2021-07-21 MED ORDER — KETAMINE HCL 10 MG/ML IJ SOLN
INTRAMUSCULAR | Status: DC | PRN
  Administered 2021-07-21: 25 mg via INTRAVENOUS

## 2021-07-21 MED ORDER — GABAPENTIN 300 MG PO CAPS
300.0000 mg | ORAL_CAPSULE | ORAL | Status: DC
Start: 2021-07-22 — End: 2021-07-21

## 2021-07-21 MED ORDER — MIDAZOLAM HCL 2 MG/2ML IJ SOLN
INTRAMUSCULAR | Status: AC
  Filled 2021-07-21: qty 2

## 2021-07-21 MED ORDER — CALCIUM POLYCARBOPHIL 625 MG PO TABS
625.0000 mg | ORAL_TABLET | Freq: Two times a day (BID) | ORAL | Status: DC
  Administered 2021-07-21 – 2021-07-27 (×5): 625 mg via ORAL
  Filled 2021-07-21 (×14): qty 1

## 2021-07-21 MED ORDER — PROMETHAZINE HCL 25 MG/ML IJ SOLN: 6.2500 mg | INTRAMUSCULAR | Status: DC | PRN

## 2021-07-21 MED ORDER — METHOCARBAMOL 1000 MG/10ML IJ SOLN
1000.0000 mg | Freq: Four times a day (QID) | INTRAVENOUS | Status: DC | PRN
  Filled 2021-07-21: qty 10

## 2021-07-21 MED ORDER — ONDANSETRON HCL 4 MG/2ML IJ SOLN
INTRAMUSCULAR | Status: AC
  Filled 2021-07-21: qty 2

## 2021-07-21 MED ORDER — PROPOFOL 10 MG/ML IV BOLUS
INTRAVENOUS | Status: DC | PRN
  Administered 2021-07-21: 200 mg via INTRAVENOUS

## 2021-07-21 MED ORDER — PIPERACILLIN-TAZOBACTAM 3.375 G IVPB 30 MIN
3.3750 g | Freq: Three times a day (TID) | INTRAVENOUS | Status: DC
  Administered 2021-07-21 – 2021-07-26 (×16): 3.375 g via INTRAVENOUS
  Filled 2021-07-21 (×30): qty 50

## 2021-07-21 MED ORDER — PHENYLEPHRINE 40 MCG/ML (10ML) SYRINGE FOR IV PUSH (FOR BLOOD PRESSURE SUPPORT)
PREFILLED_SYRINGE | INTRAVENOUS | Status: AC
  Filled 2021-07-21: qty 10

## 2021-07-21 MED ORDER — LACTATED RINGERS IV SOLN: INTRAVENOUS | Status: DC

## 2021-07-21 MED ORDER — PROCHLORPERAZINE EDISYLATE 10 MG/2ML IJ SOLN: 5.0000 mg | INTRAMUSCULAR | Status: DC | PRN

## 2021-07-21 MED ORDER — DEXAMETHASONE SODIUM PHOSPHATE 10 MG/ML IJ SOLN
INTRAMUSCULAR | Status: AC
  Filled 2021-07-21: qty 1

## 2021-07-21 MED ORDER — SODIUM CHLORIDE 0.9 % IR SOLN
Status: DC | PRN
  Administered 2021-07-21: 5000 mL

## 2021-07-21 MED ORDER — BUPIVACAINE-EPINEPHRINE (PF) 0.25% -1:200000 IJ SOLN
INTRAMUSCULAR | Status: DC | PRN
  Administered 2021-07-21: 50 mL

## 2021-07-21 MED ORDER — GADOBUTROL 1 MMOL/ML IV SOLN
8.0000 mL | Freq: Once | INTRAVENOUS | Status: AC | PRN
  Administered 2021-07-21: 8 mL via INTRAVENOUS

## 2021-07-21 MED ORDER — SODIUM CHLORIDE 0.9% FLUSH
3.0000 mL | Freq: Two times a day (BID) | INTRAVENOUS | Status: DC
  Administered 2021-07-21 – 2021-07-27 (×6): 3 mL via INTRAVENOUS

## 2021-07-21 MED ORDER — KETAMINE HCL 10 MG/ML IJ SOLN
INTRAMUSCULAR | Status: AC
  Filled 2021-07-21: qty 1

## 2021-07-21 MED ORDER — FENTANYL CITRATE (PF) 100 MCG/2ML IJ SOLN: 25.0000 ug | INTRAMUSCULAR | Status: DC | PRN

## 2021-07-21 MED ORDER — DIPHENHYDRAMINE HCL 50 MG/ML IJ SOLN: 12.5000 mg | Freq: Four times a day (QID) | INTRAMUSCULAR | Status: DC | PRN

## 2021-07-21 MED ORDER — MAGIC MOUTHWASH
15.0000 mL | Freq: Four times a day (QID) | ORAL | Status: DC | PRN
  Filled 2021-07-21: qty 15

## 2021-07-21 MED ORDER — PHENYLEPHRINE 40 MCG/ML (10ML) SYRINGE FOR IV PUSH (FOR BLOOD PRESSURE SUPPORT)
PREFILLED_SYRINGE | INTRAVENOUS | Status: DC | PRN
  Administered 2021-07-21: 120 ug via INTRAVENOUS
  Administered 2021-07-21 (×2): 80 ug via INTRAVENOUS
  Administered 2021-07-21: 120 ug via INTRAVENOUS

## 2021-07-21 MED ORDER — OXYCODONE HCL 5 MG PO TABS
5.0000 mg | ORAL_TABLET | ORAL | Status: DC | PRN
  Administered 2021-07-22 – 2021-07-23 (×2): 5 mg via ORAL
  Administered 2021-07-24 – 2021-07-27 (×5): 10 mg via ORAL
  Filled 2021-07-21 (×2): qty 2
  Filled 2021-07-21 (×2): qty 1
  Filled 2021-07-21 (×3): qty 2

## 2021-07-21 MED ORDER — ONDANSETRON HCL 4 MG/2ML IJ SOLN
4.0000 mg | Freq: Four times a day (QID) | INTRAMUSCULAR | Status: DC | PRN
Start: 2021-07-21 — End: 2021-07-27

## 2021-07-21 MED ORDER — GABAPENTIN 100 MG PO CAPS
200.0000 mg | ORAL_CAPSULE | Freq: Three times a day (TID) | ORAL | Status: DC
  Administered 2021-07-21 – 2021-07-27 (×17): 200 mg via ORAL
  Filled 2021-07-21 (×17): qty 2

## 2021-07-21 MED ORDER — OXYCODONE HCL 5 MG PO TABS: 5.0000 mg | ORAL_TABLET | Freq: Once | ORAL | Status: DC | PRN

## 2021-07-21 MED ORDER — MENTHOL 3 MG MT LOZG: 1.0000 | LOZENGE | OROMUCOSAL | Status: DC | PRN

## 2021-07-21 MED ORDER — LIDOCAINE 2% (20 MG/ML) 5 ML SYRINGE
INTRAMUSCULAR | Status: AC
  Filled 2021-07-21: qty 5

## 2021-07-21 MED ORDER — ROCURONIUM BROMIDE 10 MG/ML (PF) SYRINGE
PREFILLED_SYRINGE | INTRAVENOUS | Status: DC | PRN
  Administered 2021-07-21: 60 mg via INTRAVENOUS
  Administered 2021-07-21: 10 mg via INTRAVENOUS

## 2021-07-21 MED ORDER — ACETAMINOPHEN 500 MG PO TABS
1000.0000 mg | ORAL_TABLET | Freq: Four times a day (QID) | ORAL | Status: DC
  Administered 2021-07-21 – 2021-07-27 (×20): 1000 mg via ORAL
  Filled 2021-07-21 (×19): qty 2

## 2021-07-21 MED ORDER — DEXMEDETOMIDINE (PRECEDEX) IN NS 20 MCG/5ML (4 MCG/ML) IV SYRINGE
PREFILLED_SYRINGE | INTRAVENOUS | Status: DC | PRN
  Administered 2021-07-21: 8 ug via INTRAVENOUS

## 2021-07-21 MED ORDER — LIP MEDEX EX OINT
1.0000 "application " | TOPICAL_OINTMENT | Freq: Two times a day (BID) | CUTANEOUS | Status: DC
  Administered 2021-07-21 – 2021-07-27 (×12): 1 via TOPICAL
  Filled 2021-07-21 (×2): qty 7

## 2021-07-21 MED ORDER — FENTANYL CITRATE (PF) 250 MCG/5ML IJ SOLN
INTRAMUSCULAR | Status: DC | PRN
  Administered 2021-07-21: 100 ug via INTRAVENOUS
  Administered 2021-07-21 (×3): 50 ug via INTRAVENOUS

## 2021-07-21 MED ORDER — ENALAPRILAT 1.25 MG/ML IV SOLN
0.6250 mg | Freq: Four times a day (QID) | INTRAVENOUS | Status: DC | PRN
  Filled 2021-07-21 (×2): qty 1

## 2021-07-21 MED ORDER — GABAPENTIN 300 MG PO CAPS
300.0000 mg | ORAL_CAPSULE | ORAL | Status: AC
  Administered 2021-07-21: 300 mg via ORAL
  Filled 2021-07-21: qty 1

## 2021-07-21 MED ORDER — ALUM & MAG HYDROXIDE-SIMETH 200-200-20 MG/5ML PO SUSP: 30.0000 mL | Freq: Four times a day (QID) | ORAL | Status: DC | PRN

## 2021-07-21 MED ORDER — LABETALOL HCL 5 MG/ML IV SOLN: 10.0000 mg | Freq: Once | INTRAVENOUS | Status: AC

## 2021-07-21 MED ORDER — DEXAMETHASONE SODIUM PHOSPHATE 10 MG/ML IJ SOLN
INTRAMUSCULAR | Status: DC | PRN
  Administered 2021-07-21: 5 mg via INTRAVENOUS

## 2021-07-21 MED ORDER — PHENOL 1.4 % MT LIQD: 2.0000 | OROMUCOSAL | Status: DC | PRN

## 2021-07-21 MED ORDER — KETOROLAC TROMETHAMINE 30 MG/ML IJ SOLN
15.0000 mg | Freq: Once | INTRAMUSCULAR | Status: AC
  Administered 2021-07-21: 15 mg via INTRAVENOUS
  Filled 2021-07-21: qty 1

## 2021-07-21 MED ORDER — ACETAMINOPHEN 500 MG PO TABS
ORAL_TABLET | ORAL | Status: AC
  Filled 2021-07-21: qty 2

## 2021-07-21 MED ORDER — HYDROMORPHONE HCL 1 MG/ML IJ SOLN
1.0000 mg | Freq: Once | INTRAMUSCULAR | Status: AC
  Administered 2021-07-21: 1 mg via INTRAVENOUS
  Filled 2021-07-21: qty 1

## 2021-07-21 MED ORDER — OXYCODONE HCL 5 MG/5ML PO SOLN: 5.0000 mg | Freq: Once | ORAL | Status: DC | PRN

## 2021-07-21 MED ORDER — CARVEDILOL 25 MG PO TABS
25.0000 mg | ORAL_TABLET | Freq: Every day | ORAL | Status: DC
  Administered 2021-07-21 – 2021-07-22 (×2): 25 mg via ORAL
  Filled 2021-07-21 (×2): qty 1

## 2021-07-21 MED ORDER — LACTATED RINGERS IV BOLUS: 1000.0000 mL | Freq: Three times a day (TID) | INTRAVENOUS | Status: AC | PRN

## 2021-07-21 MED ORDER — SODIUM CHLORIDE 0.9 % IV SOLN
8.0000 mg | Freq: Four times a day (QID) | INTRAVENOUS | Status: DC | PRN
  Filled 2021-07-21: qty 4

## 2021-07-21 MED ORDER — PROPOFOL 10 MG/ML IV BOLUS
INTRAVENOUS | Status: AC
  Filled 2021-07-21: qty 20

## 2021-07-21 MED ORDER — METHOCARBAMOL 500 MG PO TABS: 1000.0000 mg | ORAL_TABLET | Freq: Four times a day (QID) | ORAL | Status: DC | PRN

## 2021-07-21 MED ORDER — 0.9 % SODIUM CHLORIDE (POUR BTL) OPTIME
TOPICAL | Status: DC | PRN
  Administered 2021-07-21: 1000 mL

## 2021-07-21 MED ORDER — SODIUM CHLORIDE 0.9% FLUSH: 3.0000 mL | INTRAVENOUS | Status: DC | PRN

## 2021-07-21 MED ORDER — LABETALOL HCL 5 MG/ML IV SOLN
INTRAVENOUS | Status: AC
  Administered 2021-07-21: 10 mg via INTRAVENOUS
  Filled 2021-07-21: qty 4

## 2021-07-21 MED ORDER — LABETALOL HCL 5 MG/ML IV SOLN: 10.0000 mg | INTRAVENOUS | Status: DC | PRN

## 2021-07-21 MED ORDER — FENTANYL CITRATE (PF) 250 MCG/5ML IJ SOLN
INTRAMUSCULAR | Status: AC
  Filled 2021-07-21: qty 5

## 2021-07-21 MED ORDER — ESMOLOL HCL 100 MG/10ML IV SOLN
INTRAVENOUS | Status: DC | PRN
  Administered 2021-07-21: 30 mg via INTRAVENOUS

## 2021-07-21 MED ORDER — ACETAMINOPHEN 325 MG PO TABS
ORAL_TABLET | ORAL | Status: AC
  Filled 2021-07-21: qty 2

## 2021-07-21 MED ORDER — ROCURONIUM BROMIDE 10 MG/ML (PF) SYRINGE
PREFILLED_SYRINGE | INTRAVENOUS | Status: AC
  Filled 2021-07-21: qty 10

## 2021-07-21 MED ORDER — ADULT MULTIVITAMIN W/MINERALS CH
1.0000 | ORAL_TABLET | Freq: Every day | ORAL | Status: DC
  Administered 2021-07-22 – 2021-07-27 (×6): 1 via ORAL
  Filled 2021-07-21 (×6): qty 1

## 2021-07-21 MED ORDER — SUGAMMADEX SODIUM 500 MG/5ML IV SOLN
INTRAVENOUS | Status: DC | PRN
  Administered 2021-07-21: 200 mg via INTRAVENOUS

## 2021-07-21 MED ORDER — SODIUM CHLORIDE 0.9 % IV SOLN: 250.0000 mL | INTRAVENOUS | Status: DC | PRN

## 2021-07-21 MED ORDER — LIDOCAINE 2% (20 MG/ML) 5 ML SYRINGE
INTRAMUSCULAR | Status: DC | PRN
  Administered 2021-07-21: 20 mg via INTRAVENOUS

## 2021-07-21 MED ORDER — BUPIVACAINE-EPINEPHRINE 0.25% -1:200000 IJ SOLN
INTRAMUSCULAR | Status: AC
  Filled 2021-07-21: qty 1

## 2021-07-21 SURGICAL SUPPLY — 64 items
APPLIER CLIP 5 13 M/L LIGAMAX5 (MISCELLANEOUS) ×2
BAG COUNTER SPONGE SURGICOUNT (BAG) IMPLANT
BLADE EXTENDED COATED 6.5IN (ELECTRODE) IMPLANT
BLADE HEX COATED 2.75 (ELECTRODE) IMPLANT
CABLE HIGH FREQUENCY MONO STRZ (ELECTRODE) ×2 IMPLANT
CHLORAPREP W/TINT 26 (MISCELLANEOUS) ×2 IMPLANT
CLIP APPLIE 5 13 M/L LIGAMAX5 (MISCELLANEOUS) ×1 IMPLANT
COUNTER NEEDLE 20 DBL MAG RED (NEEDLE) ×2 IMPLANT
COVER MAYO STAND STRL (DRAPES) ×2 IMPLANT
COVER SURGICAL LIGHT HANDLE (MISCELLANEOUS) ×2 IMPLANT
DRAIN CHANNEL 19F RND (DRAIN) ×2 IMPLANT
DRAPE LAPAROSCOPIC ABDOMINAL (DRAPES) ×2 IMPLANT
DRAPE SHEET LG 3/4 BI-LAMINATE (DRAPES) ×2 IMPLANT
DRAPE UTILITY XL STRL (DRAPES) ×4 IMPLANT
DRAPE WARM FLUID 44X44 (DRAPES) ×2 IMPLANT
DRSG OPSITE POSTOP 4X10 (GAUZE/BANDAGES/DRESSINGS) IMPLANT
DRSG OPSITE POSTOP 4X6 (GAUZE/BANDAGES/DRESSINGS) IMPLANT
DRSG OPSITE POSTOP 4X8 (GAUZE/BANDAGES/DRESSINGS) IMPLANT
DRSG TEGADERM 2-3/8X2-3/4 SM (GAUZE/BANDAGES/DRESSINGS) ×2 IMPLANT
DRSG TEGADERM 4X4.75 (GAUZE/BANDAGES/DRESSINGS) ×6 IMPLANT
ELECT REM PT RETURN 15FT ADLT (MISCELLANEOUS) ×4 IMPLANT
ENDOLOOP SUT PDS II  0 18 (SUTURE) ×1
ENDOLOOP SUT PDS II 0 18 (SUTURE) ×1 IMPLANT
EVACUATOR SILICONE 100CC (DRAIN) ×2 IMPLANT
GAUZE SPONGE 2X2 8PLY STRL LF (GAUZE/BANDAGES/DRESSINGS) ×1 IMPLANT
GAUZE SPONGE 4X4 12PLY STRL (GAUZE/BANDAGES/DRESSINGS) ×2 IMPLANT
GLOVE SURG NEOPR MICRO LF SZ8 (GLOVE) ×4 IMPLANT
GLOVE SURG UNDER LTX SZ8 (GLOVE) ×4 IMPLANT
GOWN STRL REUS W/TWL XL LVL3 (GOWN DISPOSABLE) ×4 IMPLANT
HANDLE SUCTION POOLE (INSTRUMENTS) ×1 IMPLANT
IRRIG SUCT STRYKERFLOW 2 WTIP (MISCELLANEOUS) ×2
IRRIGATION SUCT STRKRFLW 2 WTP (MISCELLANEOUS) ×1 IMPLANT
KIT BASIN OR (CUSTOM PROCEDURE TRAY) ×2 IMPLANT
KIT TURNOVER KIT A (KITS) ×2 IMPLANT
LEGGING LITHOTOMY PAIR STRL (DRAPES) IMPLANT
PACK GENERAL/GYN (CUSTOM PROCEDURE TRAY) ×2 IMPLANT
PAD POSITIONING PINK XL (MISCELLANEOUS) ×2 IMPLANT
PENCIL SMOKE EVACUATOR (MISCELLANEOUS) IMPLANT
SCISSORS LAP 5X35 DISP (ENDOMECHANICALS) ×2 IMPLANT
SET TUBE SMOKE EVAC HIGH FLOW (TUBING) ×2 IMPLANT
SLEEVE XCEL OPT CAN 5 100 (ENDOMECHANICALS) ×4 IMPLANT
SPONGE GAUZE 2X2 STER 10/PKG (GAUZE/BANDAGES/DRESSINGS) ×1
STAPLER VISISTAT 35W (STAPLE) ×2 IMPLANT
SUCTION POOLE HANDLE (INSTRUMENTS) ×2
SUT MNCRL AB 4-0 PS2 18 (SUTURE) ×2 IMPLANT
SUT PDS AB 1 CT1 27 (SUTURE) ×4 IMPLANT
SUT PDS AB 1 CTX 36 (SUTURE) ×2 IMPLANT
SUT PDS AB 1 TP1 96 (SUTURE) IMPLANT
SUT PROLENE 2 0 SH DA (SUTURE) ×2 IMPLANT
SUT SILK 0 (SUTURE)
SUT SILK 0 30XBRD TIE 6 (SUTURE) IMPLANT
SUT SILK 2 0 (SUTURE) ×1
SUT SILK 2 0 SH CR/8 (SUTURE) ×2 IMPLANT
SUT SILK 2-0 18XBRD TIE 12 (SUTURE) ×1 IMPLANT
SUT SILK 3 0 (SUTURE) ×1
SUT SILK 3 0 SH CR/8 (SUTURE) ×2 IMPLANT
SUT SILK 3-0 18XBRD TIE 12 (SUTURE) ×1 IMPLANT
SUT VICRYL 0 UR6 27IN ABS (SUTURE) IMPLANT
TAPE UMBILICAL 1/8 X36 TWILL (MISCELLANEOUS) ×2 IMPLANT
TOWEL OR 17X26 10 PK STRL BLUE (TOWEL DISPOSABLE) ×4 IMPLANT
TOWEL OR NON WOVEN STRL DISP B (DISPOSABLE) ×4 IMPLANT
TRAY FOLEY MTR SLVR 16FR STAT (SET/KITS/TRAYS/PACK) IMPLANT
TROCAR BLADELESS OPT 5 100 (ENDOMECHANICALS) ×2 IMPLANT
TROCAR XCEL BLUNT TIP 100MML (ENDOMECHANICALS) ×2 IMPLANT

## 2021-07-21 NOTE — Progress Notes (Signed)
Initial Nutrition Assessment RD working remotely.  DOCUMENTATION CODES:   Not applicable  INTERVENTION:  - diet advancement as medically feasible.    NUTRITION DIAGNOSIS:   Inadequate oral intake related to inability to eat as evidenced by NPO status.  GOAL:   Patient will meet greater than or equal to 90% of their needs  MONITOR:   Diet advancement, Labs, Weight trends, Skin, I & O's  REASON FOR ASSESSMENT:   Malnutrition Screening Tool  ASSESSMENT:   50 y.o. male with no known medical history. He presented to the ED due to abdominal pain, mainly in RUQ which radiates to his back, and N/V. In the ED he was found to have elevated BP. CT abdomen showed acute perforated gallbladder with gallstones.  He has been NPO since admission. Patient is currently in the OR.   No previous admissions. Weight today is documented as 185 lb which appears to be a stated weight. No other weight recording available in the chart. No information documented in the edema section of flow sheet.  MST note indicates that patient has had a decreased appetite and that he has lost weight but that he is unsure of how much.  Per notes: - perforated gallbladder 2/2 acute cholecystitis - in OR for planned cholecystectomy - hypertensive urgency - hyperglycemia--pending HgbA1c   Labs reviewed; Ca: 8.6 mg/dl. Medications reviewed. IVF; D5-LR @ 125 ml/hr (510 kcal/24 hrs).    NUTRITION - FOCUSED PHYSICAL EXAM:  Unable to complete at this time.   Diet Order:   Diet Order             Diet NPO time specified Except for: Ice Chips, Sips with Meds  Diet effective ____           Diet NPO time specified  Diet effective now                   EDUCATION NEEDS:   Not appropriate for education at this time  Skin:  Skin Assessment: Reviewed RN Assessment  Last BM:  PTA/unknown  Height:   Ht Readings from Last 1 Encounters:  07/21/21 5\' 10"  (1.778 m)    Weight:   Wt Readings from Last  1 Encounters:  07/21/21 83.9 kg      Estimated Nutritional Needs:  Kcal:  2250-2500 kcal Protein:  115-130 grams Fluid:  >/= 2.3 L/day      07/23/21, MS, RD, LDN, CNSC Inpatient Clinical Dietitian RD pager # available in [facility]  After hours/weekend pager # available in [facility]
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
## 2021-07-21 NOTE — Progress Notes (Signed)
I discussed operative findings, updated the patient's status, discussed probable steps to recovery, and gave postoperative recommendations to the patient's friend, Kandra Nicolas .  Recommendations were made.  Questions were answered.  He expressed understanding & appreciation.  Ardeth Sportsman, MD, FACS, MASCRS Esophageal, Gastrointestinal & Colorectal Surgery Robotic and Minimally Invasive Surgery  [facility], [facility]  [facility]  [address], [address] [place], [place] [address] [phone] Fax [phone] Main  CONTACT INFORMATION:  Weekday (9AM-5PM): Call [facility] main office at [phone]  Weeknight (5PM-9AM) or Weekend/Holiday: Check [email or web address] (password " [email or web address]") for General Surgery [facility] coverage  (Please, do not use SecureChat as it is not reliable communication to operating surgeons for immediate patient care)

## 2021-07-21 NOTE — Anesthesia Procedure Notes (Signed)
Procedure Name: Intubation Date/Time: 07/21/2021 3:38 PM Performed by: Elyn Peers, CRNA Pre-anesthesia Checklist: Patient identified, Emergency Drugs available, Suction available, Patient being monitored and Timeout performed Patient Re-evaluated:Patient Re-evaluated prior to induction Oxygen Delivery Method: Circle system utilized Preoxygenation: Pre-oxygenation with 100% oxygen Induction Type: IV induction Ventilation: Mask ventilation without difficulty Laryngoscope Size: Miller and 3 Grade View: Grade I Tube type: Oral Tube size: 7.5 mm Number of attempts: 1 Airway Equipment and Method: Stylet Placement Confirmation: ETT inserted through vocal cords under direct vision, positive ETCO2 and breath sounds checked- equal and bilateral Secured at: 23 cm Tube secured with: Tape Dental Injury: Teeth and Oropharynx as per pre-operative assessment
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
## 2021-07-21 NOTE — Transfer of Care (Signed)
Immediate Anesthesia Transfer of Care Note  Patient: Eugene Brooks  Procedure(s) Performed: LAPAROSCOPIC CHOLECSYTECTOMY; DRAINAGE OF HEMOPERITONEUM (Abdomen)  Patient Location: PACU  Anesthesia Type:General  Level of Consciousness: drowsy  Airway & Oxygen Therapy: Patient Spontanous Breathing  Post-op Assessment: Report given to RN and Post -op Vital signs reviewed and stable  Post vital signs: Reviewed and stable  Last Vitals:  Vitals Value Taken Time  BP 164/95 07/21/21 1754  Temp    Pulse 94 07/21/21 1758  Resp 13 07/21/21 1758  SpO2 95 % 07/21/21 1758  Vitals shown include unvalidated device data.  Last Pain:  Vitals:   07/21/21 1312  TempSrc:   PainSc: 7       Patients Stated Pain Goal: 3 (07/21/21 1312)  Complications: No notable events documented.

## 2021-07-21 NOTE — ED Notes (Signed)
Patient started on 

## 2021-07-21 NOTE — Discharge Instructions (Signed)
################################################################ ? ?LAPAROSCOPIC SURGERY: POST OP INSTRUCTIONS ? ?###################################################################### ? ?EAT ?Gradually transition to a high fiber diet with a fiber supplement over the next few weeks after discharge.  Start with a pureed / full liquid diet (see below) ? ?WALK ?Walk an hour a day.  Control your pain to do that.   ? ?CONTROL PAIN ?Control pain so that you can walk, sleep, tolerate sneezing/coughing, go up/down stairs. ? ?HAVE A BOWEL MOVEMENT DAILY ?Keep your bowels regular to avoid problems.  OK to try a laxative to override constipation.  OK to use an antidairrheal to slow down diarrhea.  Call if not better after 2 tries ? ?CALL IF YOU HAVE PROBLEMS/CONCERNS ?Call if you are still struggling despite following these instructions. ?Call if you have concerns not answered by these instructions ? ?###################################################################### ? ? ? ?DIET: Follow a light bland diet & liquids the first 24 hours after arrival home, such as soup, liquids, starches, etc.  Be sure to drink plenty of fluids.  Quickly advance to a usual solid diet within a few days.  Avoid fast food or heavy meals as your are more likely to get nauseated or have irregular bowels.  A low-fat, high-fiber diet for the rest of your life is ideal. ? ?Take your usually prescribed home medications unless otherwise directed. ? ?PAIN CONTROL: ?Pain is best controlled by a usual combination of three different methods TOGETHER: ?Ice/Heat ?Over the counter pain medication ?Prescription pain medication ?Most patients will experience some swelling and bruising around the incisions.  Ice packs or heating pads (30-60 minutes up to 6 times a day) will help. Use ice for the first few days to help decrease swelling and bruising, then switch to heat to help relax tight/sore spots and speed recovery.  Some people prefer to use ice alone, heat  alone, alternating between ice & heat.  Experiment to what works for you.  Swelling and bruising can take several weeks to resolve.   ?It is helpful to take an over-the-counter pain medication regularly for the first few weeks.  Choose one of the following that works best for you: ?Naproxen (Aleve, etc)  Two 220mg tabs twice a day ?Ibuprofen (Advil, etc) Three 200mg tabs four times a day (every meal & bedtime) ?Acetaminophen (Tylenol, etc) 500-650mg four times a day (every meal & bedtime) ?A  prescription for pain medication (such as oxycodone, hydrocodone, tramadol, gabapentin, methocarbamol, etc) should be given to you upon discharge.  Take your pain medication as prescribed.  ?If you are having problems/concerns with the prescription medicine (does not control pain, nausea, vomiting, rash, itching, etc), please call us [phone] to see if we need to switch you to a different pain medicine that will work better for you and/or control your side effect better. ?If you need a refill on your pain medication, please give us 48 hour notice.  contact your pharmacy.  They will contact our office to request authorization. Prescriptions will not be filled after 5 pm or on week-ends ? ?Avoid getting constipated.   ?Between the surgery and the pain medications, it is common to experience some constipation.   ?Increasing fluid intake and taking a fiber supplement (such as Metamucil, Citrucel, FiberCon, MiraLax, etc) 1-2 times a day regularly will usually help prevent this problem from occurring.   ?A mild laxative (prune juice, Milk of Magnesia, MiraLax, etc) should be taken according to package directions if there are no bowel movements after 48 hours.   ?Watch out for diarrhea.   ?  If you have many loose bowel movements, simplify your diet to bland foods & liquids for a few days.   ?Stop any stool softeners and decrease your fiber supplement.   ?Switching to mild anti-diarrheal medications (Kayopectate, Pepto Bismol) can  help.   ?If this worsens or does not improve, please call us. ? ?Wash / shower every day.  You may shower over the dressings as they are waterproof.  Continue to shower over incision(s) after the dressing is off. ? ?REMOVE ALL DRESSINGS: Remove your waterproof bandages (tegaderm clear band-aids, steristrip skin tapes, etc) THREE DAYS AFTER SURGERY.  You may leave the incisions open to air.  You may replace a dressing/Band-Aid to cover the incision for comfort if you wish.  ? ?ACTIVITIES as tolerated:   ?You may resume regular (light) daily activities beginning the next day--such as daily self-care, walking, climbing stairs--gradually increasing activities as tolerated.  If you can walk 30 minutes without difficulty, it is safe to try more intense activity such as jogging, treadmill, bicycling, low-impact aerobics, swimming, etc. ?Save the most intensive and strenuous activity for last such as sit-ups, heavy lifting, contact sports, etc  Refrain from any heavy lifting or straining until you are off narcotics for pain control.   ?DO NOT PUSH THROUGH PAIN.  Let pain be your guide: If it hurts to do something, don't do it.  Pain is your body warning you to avoid that activity for another week until the pain goes down. ?You may drive when you are no longer taking prescription pain medication, you can comfortably wear a seatbelt, and you can safely maneuver your car and apply brakes. ?You may have sexual intercourse when it is comfortable. ? ?FOLLOW UP in our office ?Please call [facility] at [phone] to set up an appointment to see your surgeon in the office for a follow-up appointment approximately 2-3 weeks after your surgery. ?Make sure that you call for this appointment the day you arrive home to insure a convenient appointment time. ? ?10. IF YOU HAVE DISABILITY OR FAMILY LEAVE FORMS, BRING THEM TO THE OFFICE FOR PROCESSING.  DO NOT GIVE THEM TO YOUR DOCTOR. ? ? ?WHEN TO CALL US [phone]: ?Poor pain  control ?Reactions / problems with new medications (rash/itching, nausea, etc)  ?Fever over 101.5 F (38.5 C) ?Inability to urinate ?Nausea and/or vomiting ?Worsening swelling or bruising ?Continued bleeding from incision. ?Increased pain, redness, or drainage from the incision ? ? The clinic staff is available to answer your questions during regular business hours (8:30am-5pm).  Please don?t hesitate to call and ask to speak to one of our nurses for clinical concerns.  ? If you have a medical emergency, go to the nearest emergency room or call 911. ? A surgeon from [facility] is always on call at the hospitals ? ? ?[facility] ?[address], [address], [place], [place]  [address] ? ?MAIN: [phone] ? TOLL FREE: [phone] ?  ?FAX [phone] ?[email or web address] ? ?############################################################## ? ? ? ?

## 2021-07-21 NOTE — Anesthesia Postprocedure Evaluation (Signed)
Anesthesia Post Note  Patient: Eugene Brooks  Procedure(s) Performed: LAPAROSCOPIC CHOLECSYTECTOMY; DRAINAGE OF HEMOPERITONEUM (Abdomen)     Patient location during evaluation: PACU Anesthesia Type: General Level of consciousness: awake and alert Pain management: pain level controlled Vital Signs Assessment: post-procedure vital signs reviewed and stable Respiratory status: spontaneous breathing, nonlabored ventilation, respiratory function stable and patient connected to nasal cannula oxygen Cardiovascular status: blood pressure returned to baseline and stable Postop Assessment: no apparent nausea or vomiting Anesthetic complications: no   No notable events documented.  Last Vitals:  Vitals:   07/21/21 2030 07/21/21 2107  BP: (!) 209/114 (!) 163/92  Pulse: 94 86  Resp: 15 18  Temp:  37.5 C  SpO2: 95% 97%    Last Pain:  Vitals:   07/21/21 2048  TempSrc:   PainSc: 0-No pain                 Kennieth Rad

## 2021-07-21 NOTE — Op Note (Signed)
07/21/2021  PATIENT:  Eugene Brooks  50 y.o. male  Patient Care Team: Pcp, No as PCP - General  PRE-OPERATIVE DIAGNOSIS:    Acute on Chronic Calculus Cholecystitis, possible perfoation  POST-OPERATIVE DIAGNOSIS:   Acute Necrotic Calculus Cholecystitis with perforation & hemoperitoneum Liver: normal  PROCEDURE:  Laparoscopic cholecystectomy (CPT code 09326) Evacuation of hemoperitoneum Diagnostic laparoscopy  SURGEON:  Ardeth Sportsman, MD, FACS.  ASSISTANT: Hedda Slade, PA-C    ANESTHESIA:    General with endotracheal intubation Local anesthetic as a field block  EBL:  (See Anesthesia Intraoperative Record) Total I/O In: 50 [IV Piggyback:50] Out: 700 [Blood:700]   Delay start of Pharmacological VTE agent (>24hrs) due to surgical blood loss or risk of bleeding:  no  DRAINS: 19 Fr Blake closed bulb drain in the RUQ   SPECIMEN: Gallbladder  & deep margins of gallbladder fossa.  DISPOSITION OF SPECIMEN:  PATHOLOGY  COUNTS:  YES  PLAN OF CARE: Admit to inpatient   PATIENT DISPOSITION:  PACU - hemodynamically stable.  INDICATION: Active male with abdominal pain 2 days ago after eating pizza.  Persistent worsening pain with nausea and vomiting.  Came to emergency department.  CT scan showing thickened gallbladder.  Concern for some free fluid.  Possible perforation.  Atypical.  MRCP done to rule out malignancy.  Not strongly suspected.  Therefore agreed to proceeding with laparoscopic possible open cholecystectomy  The anatomy & physiology of hepatobiliary & pancreatic function was discussed.  The pathophysiology of gallbladder dysfunction was discussed.  Natural history risks without surgery was discussed.   I feel the risks of no intervention will lead to serious problems that outweigh the operative risks; therefore, I recommended cholecystectomy to remove the pathology.  I explained laparoscopic techniques with possible need for an open approach.  Probable cholangiogram to  evaluate the bilary tract was explained as well.    Risks such as bleeding, infection, abscess, leak, injury to other organs, need for further treatment, heart attack, death, and other risks were discussed.  I noted a good likelihood this will help address the problem.  Possibility that this will not correct all abdominal symptoms was explained.  Goals of post-operative recovery were discussed as well.  We will work to minimize complications.  An educational handout further explaining the pathology and treatment options was given as well.  Questions were answered.  The patient expresses understanding & wishes to proceed with surgery.  OR FINDINGS: Hemoperitoneum and clot especially in right upper quadrant but also in left upper quadrant and pelvis.  700 mL total.  No active bleeding.  Necrotic gallbladder with spontaneous perforation.  Very large solitary gallstone.  Did not do cholangiogram.  Got a good critical view.  Liver: Fatty steatohepatitis  DESCRIPTION:   Informed consent was confirmed.  The patient underwent general anaesthesia without difficulty.  The patient was positioned appropriately.  VTE prevention in place.  The patient's abdomen was clipped, prepped, & draped in a sterile fashion.  Surgical timeout confirmed our plan.  Peritoneal entry with a laparoscopic port was obtained using optical entry technique in the right upper abdomen as the patient was positioned in reverse Trendelenburg.  Entry was clean.  I induced carbon dioxide insufflation.  Camera inspection revealed no injury.  Extra ports were carefully placed under direct laparoscopic visualization.  I turned attention to the right upper quadrant.  Patient had large volume of clot as well as hemoperitoneum.  Aspirated this.  Turned attention down the pelvis.  There was some bloody  fluid down there.  Aspirated in the left upper quadrant as well.  Inspection of the abdomen did not show any active bleeding.  Inspection of the  gallbladder noted that it was necrotic and ischemic.  There is a spontaneous perforation.  Very large stone within it but not emptied yet.  There is no evidence of any visceral or parietal carcinomatosis malignancy.  Gallbladder looked dead without any major phlegmon  The gallbladder fundus was elevated cephalad.  I used hook cautery to free the peritoneal coverings between the gallbladder and the liver on the posteriolateral and anteriomedial walls.   I used careful blunt and hook dissection to help get a good critical view of the cystic artery and cystic duct. I did further dissection to free 80%of the gallbladder off the liver bed to get a good critical view of the infundibulum and cystic duct.  I dissected out the cystic artery; and, after getting a good 360 view, ligated the anterior & posterior branches of the cystic artery close on the infundibulum using clips & hook dissection.  I skeletonized the cystic duct.  It was rather ischemic except for the last centimeter close 60 to the common bile duct.. I placed clips on the cystic duct x4.  I completed cystic duct transection.  Given ischemia on a fair amount of cystic duct I ligated on the base with 0 PDS Endoloop where it looked viable and pink.  I freed the gallbladder from its remaining attachments to the liver.  I ended up working to have a 5-7 mm margin on the gallbladder hepatic fossa since this was odd and atypical.  1 to make sure there was negative margins in place there could have been a small malignancy.  Since some tissue from the gallbladder fossa of the liver separated for a deep margin.  I ensured hemostasis on the gallbladder fossa of the liver and elsewhere. I inspected the rest of the abdomen & detected no injury nor bleeding elsewhere.  I placed gallbladder and stone inside and EcoSac and removed it.  I had to open up the fascia around 3 cm to get the gallbladder and large stone out.  I then focused on diagnostic laparoscopy.   Irrigated several liters of saline.  Focused down the pelvis right and left upper quadrants.  Hemostasis looked good.  Old pneumoperitoneum evacuated.  I ran the small bowel from the ileocecal valve proximally and saw no perforation or other abnormality.  Stomach and duodenal sweep appeared normal not consistent with any perforated ulcer or other abnormality.  Colon from cecum to rectal peritoneal reflection looked viable without any other abnormalities.  Liver looked rather normal with some mild fatty change.  I closed the subxiphoid fascia transversely using #1 PDS interrupted stitches. I closed the skin using 4-0 monocryl stitch.  Sterile dressings were applied. The patient was extubated & arrived in the PACU in stable condition..  I had discussed postoperative care with the patient in the holding area.  I made an attempt to locate & reach the desired patient contact to discuss the patient's overall status and my recommendations.  No one is available at this time.  My plans & instructions have been written in the chart.  Ardeth Sportsman, M.D., F.A.C.S. Gastrointestinal and Minimally Invasive Surgery [facility] [address] [place], [place] [address] [phone] Main / Paging  07/21/2021 5:46 PM

## 2021-07-21 NOTE — Anesthesia Preprocedure Evaluation (Addendum)
Anesthesia Evaluation  Patient identified by MRN, date of birth, ID band Patient awake    Reviewed: Allergy & Precautions, NPO status , Patient's Chart, lab work & pertinent test results  History of Anesthesia Complications Negative for: history of anesthetic complications  Airway Mallampati: I  TM Distance: >3 FB Neck ROM: Full    Dental  (+) Chipped, Dental Advisory Given   Pulmonary Current Smoker and Patient abstained from smoking.,  07/20/2021 SARS coronavirus NEG Small bilateral pleural effusions and bibasilar atelectasis on MRI    breath sounds clear to auscultation       Cardiovascular hypertension (not on meds, new diagnosis), (-) angina Rhythm:Regular Rate:Normal     Neuro/Psych negative neurological ROS  negative psych ROS   GI/Hepatic negative GI ROS, Neg liver ROS,   Endo/Other  negative endocrine ROS  Renal/GU negative Renal ROS     Musculoskeletal negative musculoskeletal ROS (+)   Abdominal   Peds  Hematology  (+) Sickle cell trait and anemia ,   Anesthesia Other Findings   Reproductive/Obstetrics                            Anesthesia Physical Anesthesia Plan  ASA: 2  Anesthesia Plan: General   Post-op Pain Management:    Induction: Intravenous  PONV Risk Score and Plan: 4 or greater and Treatment may vary due to age or medical condition, Ondansetron, Midazolam, Dexamethasone and Scopolamine patch - Pre-op  Airway Management Planned: Oral ETT  Additional Equipment: None  Intra-op Plan:   Post-operative Plan: Extubation in OR  Informed Consent: I have reviewed the patients History and Physical, chart, labs and discussed the procedure including the risks, benefits and alternatives for the proposed anesthesia with the patient or authorized representative who has indicated his/her understanding and acceptance.     Dental advisory given  Plan Discussed with:  CRNA, Anesthesiologist and Surgeon  Anesthesia Plan Comments:        Anesthesia Quick Evaluation

## 2021-07-22 ENCOUNTER — Encounter (HOSPITAL_COMMUNITY): Payer: Self-pay | Admitting: Surgery

## 2021-07-22 DIAGNOSIS — K661 Hemoperitoneum: Secondary | ICD-10-CM

## 2021-07-22 DIAGNOSIS — K829 Disease of gallbladder, unspecified: Secondary | ICD-10-CM

## 2021-07-22 LAB — HEPATIC FUNCTION PANEL
ALT: 89 U/L — ABNORMAL HIGH (ref 0–44)
AST: 114 U/L — ABNORMAL HIGH (ref 15–41)
Albumin: 3.4 g/dL — ABNORMAL LOW (ref 3.5–5.0)
Alkaline Phosphatase: 65 U/L (ref 38–126)
Bilirubin, Direct: 0.5 mg/dL — ABNORMAL HIGH (ref 0.0–0.2)
Indirect Bilirubin: 1.9 mg/dL — ABNORMAL HIGH (ref 0.3–0.9)
Total Bilirubin: 2.4 mg/dL — ABNORMAL HIGH (ref 0.3–1.2)
Total Protein: 6.3 g/dL — ABNORMAL LOW (ref 6.5–8.1)

## 2021-07-22 LAB — CBC
HCT: 31.4 % — ABNORMAL LOW (ref 39.0–52.0)
Hemoglobin: 10.2 g/dL — ABNORMAL LOW (ref 13.0–17.0)
MCH: 28.5 pg (ref 26.0–34.0)
MCHC: 32.5 g/dL (ref 30.0–36.0)
MCV: 87.7 fL (ref 80.0–100.0)
Platelets: 260 10*3/uL (ref 150–400)
RBC: 3.58 MIL/uL — ABNORMAL LOW (ref 4.22–5.81)
RDW: 15.9 % — ABNORMAL HIGH (ref 11.5–15.5)
WBC: 13.2 10*3/uL — ABNORMAL HIGH (ref 4.0–10.5)
nRBC: 0 % (ref 0.0–0.2)

## 2021-07-22 LAB — MAGNESIUM: Magnesium: 1.8 mg/dL (ref 1.7–2.4)

## 2021-07-22 LAB — CREATININE, SERUM
Creatinine, Ser: 1.07 mg/dL (ref 0.61–1.24)
GFR, Estimated: >60 mL/min (ref >60)

## 2021-07-22 LAB — POTASSIUM: Potassium: 3.5 mmol/L (ref 3.5–5.1)

## 2021-07-22 MED ORDER — KETOROLAC TROMETHAMINE 15 MG/ML IJ SOLN: 15.0000 mg | Freq: Three times a day (TID) | INTRAMUSCULAR | Status: DC

## 2021-07-22 MED ORDER — CARVEDILOL 12.5 MG PO TABS
12.5000 mg | ORAL_TABLET | Freq: Two times a day (BID) | ORAL | Status: DC
  Administered 2021-07-23 – 2021-07-24 (×3): 12.5 mg via ORAL
  Filled 2021-07-22 (×3): qty 1

## 2021-07-22 MED ORDER — KCL-LACTATED RINGERS-D5W 20 MEQ/L IV SOLN
INTRAVENOUS | Status: DC
  Filled 2021-07-22 (×9): qty 1000

## 2021-07-22 MED ORDER — KETOROLAC TROMETHAMINE 15 MG/ML IJ SOLN
15.0000 mg | Freq: Three times a day (TID) | INTRAMUSCULAR | Status: AC
  Administered 2021-07-22 – 2021-07-27 (×15): 15 mg via INTRAVENOUS
  Filled 2021-07-22 (×15): qty 1

## 2021-07-22 NOTE — Plan of Care (Signed)

## 2021-07-22 NOTE — Plan of Care (Signed)
  Problem: Education: Goal: Knowledge of General Education information will improve Description: Including pain rating scale, medication(s)/side effects and non-pharmacologic comfort measures Outcome: Progressing   Problem: Activity: Goal: Risk for activity intolerance will decrease Outcome: Progressing   Problem: Elimination: Goal: Will not experience complications related to bowel motility Outcome: Progressing Goal: Will not experience complications related to urinary retention Outcome: Progressing   
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
## 2021-07-22 NOTE — Progress Notes (Signed)
Pt ambulated on hall way at this time via walker, tolerated well.  ?

## 2021-07-23 LAB — COMPREHENSIVE METABOLIC PANEL
ALT: 78 U/L — ABNORMAL HIGH (ref 0–44)
AST: 68 U/L — ABNORMAL HIGH (ref 15–41)
Albumin: 3 g/dL — ABNORMAL LOW (ref 3.5–5.0)
Alkaline Phosphatase: 63 U/L (ref 38–126)
Anion gap: 4 — ABNORMAL LOW (ref 5–15)
BUN: 14 mg/dL (ref 6–20)
CO2: 29 mmol/L (ref 22–32)
Calcium: 8.2 mg/dL — ABNORMAL LOW (ref 8.9–10.3)
Chloride: 106 mmol/L (ref 98–111)
Creatinine, Ser: 1.19 mg/dL (ref 0.61–1.24)
GFR, Estimated: >60 mL/min (ref >60)
Glucose, Bld: 96 mg/dL (ref 70–99)
Potassium: 3.4 mmol/L — ABNORMAL LOW (ref 3.5–5.1)
Sodium: 139 mmol/L (ref 135–145)
Total Bilirubin: 1.1 mg/dL (ref 0.3–1.2)
Total Protein: 5.8 g/dL — ABNORMAL LOW (ref 6.5–8.1)

## 2021-07-23 LAB — CBC
HCT: 29.4 % — ABNORMAL LOW (ref 39.0–52.0)
Hemoglobin: 9.5 g/dL — ABNORMAL LOW (ref 13.0–17.0)
MCH: 28.4 pg (ref 26.0–34.0)
MCHC: 32.3 g/dL (ref 30.0–36.0)
MCV: 88 fL (ref 80.0–100.0)
Platelets: 287 10*3/uL (ref 150–400)
RBC: 3.34 MIL/uL — ABNORMAL LOW (ref 4.22–5.81)
RDW: 15.9 % — ABNORMAL HIGH (ref 11.5–15.5)
WBC: 8.3 10*3/uL (ref 4.0–10.5)
nRBC: 0 % (ref 0.0–0.2)

## 2021-07-23 LAB — MAGNESIUM: Magnesium: 1.9 mg/dL (ref 1.7–2.4)

## 2021-07-23 LAB — SURGICAL PATHOLOGY

## 2021-07-23 MED ORDER — POTASSIUM CHLORIDE 10 MEQ/100ML IV SOLN
10.0000 meq | INTRAVENOUS | Status: AC
  Administered 2021-07-23 (×2): 10 meq via INTRAVENOUS
  Filled 2021-07-23 (×3): qty 100

## 2021-07-23 MED ORDER — ENSURE ENLIVE PO LIQD
237.0000 mL | Freq: Two times a day (BID) | ORAL | Status: DC
  Administered 2021-07-23 – 2021-07-27 (×9): 237 mL via ORAL

## 2021-07-23 MED ORDER — POTASSIUM CHLORIDE 10 MEQ/100ML IV SOLN
10.0000 meq | INTRAVENOUS | Status: AC
Start: 2021-07-23 — End: 2021-07-24
  Administered 2021-07-23 – 2021-07-24 (×2): 10 meq via INTRAVENOUS
  Filled 2021-07-23: qty 100

## 2021-07-23 NOTE — TOC Transition Note (Signed)
Transition of Care [facility]) - CM/SW Discharge Note   Patient Details  Name: Atwood Adcock MRN: [id number] Date of Birth: 1971/03/31  [facility] CM/SW Contact:  Lanier Clam, RN Phone Number: 07/23/2021, 2:01 PM   Clinical Narrative: Provided patient w/pcp listing for primary care [facility]-he can make own appt. Patient works-can pay for meds reasonable-he has coreg at home,currently all other meds OTC. Provided w/marketplace health insurance info. No further CM needs.      Final next level of care: Home/Self Care Barriers to Discharge: Continued Medical Work up   Patient Goals and CMS Choice Patient states their goals for this hospitalization and ongoing recovery are:: go home CMS Medicare.gov Compare Post Acute Care list provided to:: Patient    Discharge Placement                       Discharge Plan and Services   Discharge Planning Services: CM Consult                                 Social Determinants of Health (SDOH) Interventions     Readmission Risk Interventions No flowsheet data found.

## 2021-07-23 NOTE — Progress Notes (Signed)
   07/23/21 1500  Mobility  Activity Refused mobility   Pt refuse mobility today, stated he was out of bed twice today and walked already. Also stated he has a walk scheduled for this evening.   [facility] [employer] Mobility Specialist Acute Rehab Services Office: [phone]

## 2021-07-23 NOTE — Progress Notes (Signed)
[facility]  PROGRESS NOTE  Eugene Brooks LKJ:[id number] DOB: [birth year] DOA: 07/20/2021 PCP: Pcp, No   Brief HPI:   50 year old male with no significant medical history presented with abdominal pain in the right upper quadrant.  CT scan of the abdomen/pelvis showed acute perforated gallbladder disease with gallstones.  Surgery was consulted. Patient also found to have elevated blood pressure in the ED.   Subjective   Patient seen and examined, still has abdominal pain.  No BM yet.   Assessment/Plan:    Perforated gallbladder/hemoperitoneum -Secondary to acute necrotic calculus cholecystitis -Underwent laparoscopic cholecystectomy per general surgery -Management per general surgery  Hypertensive urgency -No previous history of hypertension -Presented with SBP 240 mmHg -Resolved  Hypertension -Blood pressure labile -Started on Coreg 12.5 mg p.o. twice daily -Hydralazine as needed -Might need to add another medication if blood pressure does not stabilize over next 1 to 2 days.   Hyperglycemia -Check hemoglobin A1c  Hypokalemia -Potassium is 3.4 -Potassium added to IV fluids  Scheduled medications:      Data Reviewed:   CBG:  No results for input(s): GLUCAP in the last 168 hours.  SpO2: 94 % O2 Flow Rate (L/min): 2 L/min    Vitals:   07/22/21 1313 07/22/21 2038 07/23/21 0458 07/23/21 1000  BP: (!) 160/76 139/74 (!) 160/76   Pulse: 74 68 67   Resp: 16 18 18    Temp: 98.2 F (36.8 C) 98.6 F (37 C) 98.3 F (36.8 C) 98.5 F (36.9 C)  TempSrc: Oral Oral Oral Oral  SpO2: 94% 95% 91% 94%  Weight:      Height:         Intake/Output Summary (Last 24 hours) at 07/23/2021 1451 Last data filed at 07/23/2021 0800 Gross per 24 hour  Intake 2354.28 ml  Output 1245 ml  Net 1109.28 ml    08/17 1901 - 08/19 0700 In: 5460.4 [P.O.:1260; I.V.:4000] Out: 2680 [Urine:2550; Drains:130]  Filed Weights   07/21/21 0234  Weight: 83.9 kg    CBC:  Recent  Labs  Lab 07/20/21 1349 07/21/21 0345 07/21/21 1227 07/22/21 0613 07/23/21 0501  WBC 13.5* 12.3* 11.0* 13.2* 8.3  HGB 14.7 12.8* 12.4* 10.2* 9.5*  HCT 44.3 38.7* 37.5* 31.4* 29.4*  PLT 290 328 307 260 287  MCV 86.2 84.7 86.0 87.7 88.0  MCH 28.6 28.0 28.4 28.5 28.4  MCHC 33.2 33.1 33.1 32.5 32.3  RDW 16.5* 15.9* 15.8* 15.9* 15.9*    Complete metabolic panel:  Recent Labs  Lab 07/20/21 1705 07/21/21 0345 07/21/21 1227 07/22/21 0613 07/23/21 0501  NA 136 135 136  --  139  K 3.7 4.1 3.8 3.5 3.4*  CL 98 102 99  --  106  CO2 24 23 28   --  29  GLUCOSE 157* 120* 134*  --  96  BUN 19 16 17   --  14  CREATININE 1.20 1.02 0.98 1.07 1.19  CALCIUM 8.6* 8.6* 8.6*  --  8.2*  AST 25 22  --  114* 68*  ALT 29 23  --  89* 78*  ALKPHOS 85 74  --  65 63  BILITOT 1.3* 1.4*  --  2.4* 1.1  ALBUMIN 4.5 4.2  --  3.4* 3.0*  MG  --   --   --  1.8 1.9    Recent Labs  Lab 07/20/21 1705  LIPASE 25    Recent Labs  Lab 07/20/21 1848  SARSCOV2NAA NEGATIVE    ------------------------------------------------------------------------------------------------------------------ No results for input(s): CHOL, HDL, LDLCALC,  TRIG, CHOLHDL, LDLDIRECT in the last 72 hours.  No results found for: HGBA1C ------------------------------------------------------------------------------------------------------------------ No results for input(s): TSH, T4TOTAL, T3FREE, THYROIDAB in the last 72 hours.  Invalid input(s): FREET3 ------------------------------------------------------------------------------------------------------------------ No results for input(s): VITAMINB12, FOLATE, FERRITIN, TIBC, IRON, RETICCTPCT in the last 72 hours.  Coagulation profile No results for input(s): INR, PROTIME in the last 168 hours. No results for input(s): DDIMER in the last 72 hours.  Cardiac Enzymes No results for input(s): CKTOTAL, CKMB, CKMBINDEX, TROPONINI in the last 168  hours.  ------------------------------------------------------------------------------------------------------------------ No results found for: BNP   Antibiotics: Anti-infectives (From admission, onward)    Start     Dose/Rate Route Frequency Ordered Stop   07/21/21 1330  piperacillin-tazobactam (ZOSYN) IVPB 3.375 g        3.375 g 12.5 mL/hr over 240 Minutes Intravenous Every 8 hours 07/21/21 1227     07/20/21 2200  ceFEPIme (MAXIPIME) 2 g in sodium chloride 0.9 % 100 mL IVPB  Status:  Discontinued        2 g 200 mL/hr over 30 Minutes Intravenous Every 8 hours 07/20/21 2103 07/21/21 1228   07/20/21 2200  metroNIDAZOLE (FLAGYL) IVPB 500 mg  Status:  Discontinued        500 mg 100 mL/hr over 60 Minutes Intravenous Every 12 hours 07/20/21 2103 07/21/21 1228   07/20/21 1900  piperacillin-tazobactam (ZOSYN) IVPB 3.375 g        3.375 g 100 mL/hr over 30 Minutes Intravenous  Once 07/20/21 1848 07/20/21 2000        Radiology Reports  No results found.    DVT prophylaxis: SCDs  Family Communication: No family at bedside   Consultants: General surgery  Procedures: None    Objective    Physical Examination:  General-appears in no acute distress Heart-S1-S2, regular, no murmur auscultated Lungs-clear to auscultation bilaterally, no wheezing or crackles auscultated Abdomen-soft, nontender, no organomegaly Extremities-no edema in the lower extremities Neuro-alert, oriented x3, no focal deficit noted  Status is: Inpatient  Dispo: The patient is from: Home              Anticipated d/c is to: Home              Anticipated d/c date is: 07/24/2021              Patient currently not stable for discharge  Barrier to discharge-will require surgery for perforated gallbladder  COVID-19 Labs  No results for input(s): DDIMER, FERRITIN, LDH, CRP in the last 72 hours.  Lab Results  Component Value Date   SARSCOV2NAA NEGATIVE 07/20/2021    Microbiology  Recent Results  (from the past 240 hour(s))  Resp Panel by RT-PCR (Flu A&B, Covid) Nasopharyngeal Swab     Status: None   Collection Time: 07/20/21  6:48 PM   Specimen: Nasopharyngeal Swab; Nasopharyngeal(NP) swabs in vial transport medium  Result Value Ref Range Status   SARS Coronavirus 2 by RT PCR NEGATIVE NEGATIVE Final    Comment: (NOTE) SARS-CoV-2 target nucleic acids are NOT DETECTED.  The SARS-CoV-2 RNA is generally detectable in upper respiratory specimens during the acute phase of infection. The lowest concentration of SARS-CoV-2 viral copies this assay can detect is 138 copies/mL. A negative result does not preclude SARS-Cov-2 infection and should not be used as the sole basis for treatment or other patient management decisions. A negative result may occur with  improper specimen collection/handling, submission of specimen other than nasopharyngeal swab, presence of viral mutation(s) within the areas targeted by  this assay, and inadequate number of viral copies(<138 copies/mL). A negative result must be combined with clinical observations, patient history, and epidemiological information. The expected result is Negative.  Fact Sheet for Patients:  BloggerCourse.com  Fact Sheet for Healthcare Providers:  SeriousBroker.it  This test is no t yet approved or cleared by the [place] FDA and  has been authorized for detection and/or diagnosis of SARS-CoV-2 by FDA under an Emergency Use Authorization (EUA). This EUA will remain  in effect (meaning this test can be used) for the duration of the COVID-19 declaration under Section 564(b)(1) of the Act, 21 U.S.C.section 360bbb-3(b)(1), unless the authorization is terminated  or revoked sooner.       Influenza A by PCR NEGATIVE NEGATIVE Final   Influenza B by PCR NEGATIVE NEGATIVE Final    Comment: (NOTE) The Xpert Xpress SARS-CoV-2/FLU/RSV plus assay is intended as an aid in the  diagnosis of influenza from Nasopharyngeal swab specimens and should not be used as a sole basis for treatment. Nasal washings and aspirates are unacceptable for Xpert Xpress SARS-CoV-2/FLU/RSV testing.  Fact Sheet for Patients: BloggerCourse.com  Fact Sheet for Healthcare Providers: SeriousBroker.it  This test is not yet approved or cleared by the [place] FDA and has been authorized for detection and/or diagnosis of SARS-CoV-2 by FDA under an Emergency Use Authorization (EUA). This EUA will remain in effect (meaning this test can be used) for the duration of the COVID-19 declaration under Section 564(b)(1) of the Act, 21 U.S.C. section 360bbb-3(b)(1), unless the authorization is terminated or revoked.  Performed at [facility], [address], [place], [place] [address]         Eugene Brooks   [facility] If 7PM-7AM, please contact night-coverage at [email or web address], Office  [phone]   07/23/2021, 2:51 PM  LOS: 3 days

## 2021-07-23 NOTE — Plan of Care (Signed)

## 2021-07-23 NOTE — Plan of Care (Signed)
Pt states he is feeling better today than yesterday. Pt has been up several times to the bathroom with minimal assist. Pt is receiving Potassium (x4 ) because his K+ was 3.4 this morning. Pt received a new #22 IV to his right FA because the other IV started to leak and was removed. Pts diet has been advanced to a soft diet. Pt has had two bowel movements today. Will continue to monitor.
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
## 2021-07-23 NOTE — Progress Notes (Signed)
Patient ambulated on the hallway with walker by Nurse Tech. Able to pass gas. Tolerated well.

## 2021-07-24 ENCOUNTER — Encounter (HOSPITAL_COMMUNITY): Payer: Self-pay | Admitting: Internal Medicine

## 2021-07-24 LAB — BASIC METABOLIC PANEL
Anion gap: 6 (ref 5–15)
BUN: 11 mg/dL (ref 6–20)
CO2: 26 mmol/L (ref 22–32)
Calcium: 8.7 mg/dL — ABNORMAL LOW (ref 8.9–10.3)
Chloride: 106 mmol/L (ref 98–111)
Creatinine, Ser: 1.17 mg/dL (ref 0.61–1.24)
GFR, Estimated: >60 mL/min (ref >60)
Glucose, Bld: 115 mg/dL — ABNORMAL HIGH (ref 70–99)
Potassium: 3.9 mmol/L (ref 3.5–5.1)
Sodium: 138 mmol/L (ref 135–145)

## 2021-07-24 LAB — MAGNESIUM: Magnesium: 2 mg/dL (ref 1.7–2.4)

## 2021-07-24 MED ORDER — AMLODIPINE BESYLATE 5 MG PO TABS
5.0000 mg | ORAL_TABLET | Freq: Every day | ORAL | Status: DC
  Administered 2021-07-24: 5 mg via ORAL
  Filled 2021-07-24: qty 1

## 2021-07-24 MED ORDER — CARVEDILOL 12.5 MG PO TABS
12.5000 mg | ORAL_TABLET | Freq: Once | ORAL | Status: AC
  Administered 2021-07-24: 12.5 mg via ORAL
  Filled 2021-07-24: qty 1

## 2021-07-24 MED ORDER — CARVEDILOL 25 MG PO TABS
25.0000 mg | ORAL_TABLET | Freq: Two times a day (BID) | ORAL | Status: DC
  Administered 2021-07-24 – 2021-07-27 (×6): 25 mg via ORAL
  Filled 2021-07-24 (×6): qty 1

## 2021-07-24 MED ORDER — HYDRALAZINE HCL 20 MG/ML IJ SOLN
10.0000 mg | Freq: Four times a day (QID) | INTRAMUSCULAR | Status: DC | PRN
  Administered 2021-07-24 – 2021-07-25 (×2): 10 mg via INTRAVENOUS
  Filled 2021-07-24 (×4): qty 1

## 2021-07-24 NOTE — Progress Notes (Addendum)
[facility]  PROGRESS NOTE  Eugene Brooks NKN:[id number] DOB: 05/15/1971 DOA: 07/20/2021 PCP: Pcp, No   Brief HPI:   50 year old male with no significant medical history presented with abdominal pain in the right upper quadrant.  CT scan of the abdomen/pelvis showed acute perforated gallbladder disease with gallstones.  Surgery was consulted and patient underwent laparoscopic cholecystectomy with placement of drain.  Course complicated by uncontrolled hypertension.   Subjective   Patient interviewed and examined along with his RN in the room.  Reports unable to sleep due to lots of disturbance.  States that he has high pain tolerance and intermittent 7/10 abdominal pain.  Tolerating diet.  Volunteers that he has not taken his antihypertensive medications for almost 2 years   Assessment/Plan:    Perforated gallbladder/hemoperitoneum -Secondary to acute necrotic calculus cholecystitis -Underwent laparoscopic cholecystectomy, evacuation of hemoperitoneum and diagnostic laparoscopy on 8/17. -Management per general surgery: Advance diet as tolerated, continue drain and antibiotics for 5 days.  Has been on soft diet since 8/19.  He is on IV Zosyn.  Hypertensive urgency/hypertension -"No previous history of hypertension" >not sure if this is accurate.  Patient has carvedilol 25 Mg daily listed in his medication list but reportedly has not been taking it for almost 2 years.  Reports that he is very picky about taking medications. -Presented with SBP 240 mmHg -BP control remains suboptimal.  BP 186/102 this morning and 207/98 earlier this afternoon. -BP remains uncontrolled despite increasing carvedilol from 12.5 twice daily to 25 Mg twice daily. -Added amlodipine 5 Mg daily which can be titrated up to 10 Mg daily.  Discontinued IV as needed enalaprilat.  Started IV as needed hydralazine. -May need to consider evaluation for secondary causes of hypertension if this has not already been  done. - Suspect uncontrolled chronic hypertension  Hyperglycemia -Check hemoglobin A1c-added to a.m. labs.  Hypokalemia -Resolved.  Acute posthemorrhagic anemia Hemoglobin dropped from 14.7 on admission to 9.5 today. Follow CBC daily and transfuse if hemoglobin 7 g or less.  Abnormal LFTs Suspect due to above biliary disease.  Mild.  Follow CMP in AM.  Scheduled medications:      Data Reviewed:   CBG:  No results for input(s): GLUCAP in the last 168 hours.  SpO2: 99 % O2 Flow Rate (L/min): 2 L/min    Vitals:   07/23/21 2231 07/24/21 0409 07/24/21 0840 07/24/21 1500  BP: (!) 174/93 (!) 205/95 (!) 186/102 (!) 207/98  Pulse: 66 71 68 (!) 59  Resp: 18 18 14 16   Temp: 98.6 F (37 C) 98.6 F (37 C) 98.5 F (36.9 C) 98.4 F (36.9 C)  TempSrc:  Oral Oral Oral  SpO2: 95% 94% 95% 99%  Weight:      Height:         Intake/Output Summary (Last 24 hours) at 07/24/2021 1537 Last data filed at 07/24/2021 0855 Gross per 24 hour  Intake 2250.91 ml  Output 2220 ml  Net 30.91 ml    08/18 1901 - 08/20 0700 In: 4203.8 [P.O.:420; I.V.:3572.3] Out: 2530 [Urine:2425; Drains:105]  Filed Weights   07/21/21 0234  Weight: 83.9 kg    CBC:  Recent Labs  Lab 07/20/21 1349 07/21/21 0345 07/21/21 1227 07/22/21 0613 07/23/21 0501  WBC 13.5* 12.3* 11.0* 13.2* 8.3  HGB 14.7 12.8* 12.4* 10.2* 9.5*  HCT 44.3 38.7* 37.5* 31.4* 29.4*  PLT 290 328 307 260 287  MCV 86.2 84.7 86.0 87.7 88.0  MCH 28.6 28.0 28.4 28.5 28.4  MCHC 33.2 33.1  33.1 32.5 32.3  RDW 16.5* 15.9* 15.8* 15.9* 15.9*    Complete metabolic panel:  Recent Labs  Lab 07/20/21 1705 07/21/21 0345 07/21/21 1227 07/22/21 0613 07/23/21 0501 07/24/21 0501  NA 136 135 136  --  139 138  K 3.7 4.1 3.8 3.5 3.4* 3.9  CL 98 102 99  --  106 106  CO2 24 23 28   --  29 26  GLUCOSE 157* 120* 134*  --  96 115*  BUN 19 16 17   --  14 11  CREATININE 1.20 1.02 0.98 1.07 1.19 1.17  CALCIUM 8.6* 8.6* 8.6*  --  8.2* 8.7*   AST 25 22  --  114* 68*  --   ALT 29 23  --  89* 78*  --   ALKPHOS 85 74  --  65 63  --   BILITOT 1.3* 1.4*  --  2.4* 1.1  --   ALBUMIN 4.5 4.2  --  3.4* 3.0*  --   MG  --   --   --  1.8 1.9 2.0    Recent Labs  Lab 07/20/21 1705  LIPASE 25    Recent Labs  Lab 07/20/21 1848  SARSCOV2NAA NEGATIVE      Antibiotics: Anti-infectives (From admission, onward)    Start     Dose/Rate Route Frequency Ordered Stop   07/21/21 1330  piperacillin-tazobactam (ZOSYN) IVPB 3.375 g        3.375 g 12.5 mL/hr over 240 Minutes Intravenous Every 8 hours 07/21/21 1227     07/20/21 2200  ceFEPIme (MAXIPIME) 2 g in sodium chloride 0.9 % 100 mL IVPB  Status:  Discontinued        2 g 200 mL/hr over 30 Minutes Intravenous Every 8 hours 07/20/21 2103 07/21/21 1228   07/20/21 2200  metroNIDAZOLE (FLAGYL) IVPB 500 mg  Status:  Discontinued        500 mg 100 mL/hr over 60 Minutes Intravenous Every 12 hours 07/20/21 2103 07/21/21 1228   07/20/21 1900  piperacillin-tazobactam (ZOSYN) IVPB 3.375 g        3.375 g 100 mL/hr over 30 Minutes Intravenous  Once 07/20/21 1848 07/20/21 2000        Radiology Reports  No results found.    DVT prophylaxis: SCDs  Family Communication: No family at bedside   Consultants: General surgery  Procedures: As above    Objective    Physical Examination:  General exam: Young male, well-built and nourished lying comfortably propped up in bed without distress. Respiratory system: Clear to auscultation. Respiratory effort normal. Cardiovascular system: S1 & S2 heard, RRR. No JVD, murmurs, rubs, gallops or clicks. No pedal edema. Gastrointestinal system: Abdomen is nondistended, soft and with appropriate postop right upper quadrant tenderness.  No organomegaly or masses felt. Normal bowel sounds heard. Central nervous system: Alert and oriented. No focal neurological deficits. Extremities: Symmetric 5 x 5 power. Skin: No rashes, lesions or  ulcers Psychiatry: Judgement and insight appear normal. Mood & affect appropriate.   Status is: Inpatient  Dispo: The patient is from: Home              Anticipated d/c is to: Home              Anticipated d/c date is: 07/26/2021              Patient currently not stable for discharge  Barrier to discharge-pending clearance by general surgery and adequate BP control  COVID-19 Labs  No results  for input(s): DDIMER, FERRITIN, LDH, CRP in the last 72 hours.  Lab Results  Component Value Date   SARSCOV2NAA NEGATIVE 07/20/2021    Microbiology  Recent Results (from the past 240 hour(s))  Resp Panel by RT-PCR (Flu A&B, Covid) Nasopharyngeal Swab     Status: None   Collection Time: 07/20/21  6:48 PM   Specimen: Nasopharyngeal Swab; Nasopharyngeal(NP) swabs in vial transport medium  Result Value Ref Range Status   SARS Coronavirus 2 by RT PCR NEGATIVE NEGATIVE Final    Comment: (NOTE) SARS-CoV-2 target nucleic acids are NOT DETECTED.  The SARS-CoV-2 RNA is generally detectable in upper respiratory specimens during the acute phase of infection. The lowest concentration of SARS-CoV-2 viral copies this assay can detect is 138 copies/mL. A negative result does not preclude SARS-Cov-2 infection and should not be used as the sole basis for treatment or other patient management decisions. A negative result may occur with  improper specimen collection/handling, submission of specimen other than nasopharyngeal swab, presence of viral mutation(s) within the areas targeted by this assay, and inadequate number of viral copies(<138 copies/mL). A negative result must be combined with clinical observations, patient history, and epidemiological information. The expected result is Negative.  Fact Sheet for Patients:  BloggerCourse.com  Fact Sheet for Healthcare Providers:  SeriousBroker.it  This test is no t yet approved or cleared by the  [place] FDA and  has been authorized for detection and/or diagnosis of SARS-CoV-2 by FDA under an Emergency Use Authorization (EUA). This EUA will remain  in effect (meaning this test can be used) for the duration of the COVID-19 declaration under Section 564(b)(1) of the Act, 21 U.S.C.section 360bbb-3(b)(1), unless the authorization is terminated  or revoked sooner.       Influenza A by PCR NEGATIVE NEGATIVE Final   Influenza B by PCR NEGATIVE NEGATIVE Final    Comment: (NOTE) The Xpert Xpress SARS-CoV-2/FLU/RSV plus assay is intended as an aid in the diagnosis of influenza from Nasopharyngeal swab specimens and should not be used as a sole basis for treatment. Nasal washings and aspirates are unacceptable for Xpert Xpress SARS-CoV-2/FLU/RSV testing.  Fact Sheet for Patients: BloggerCourse.com  Fact Sheet for Healthcare Providers: SeriousBroker.it  This test is not yet approved or cleared by the [place] FDA and has been authorized for detection and/or diagnosis of SARS-CoV-2 by FDA under an Emergency Use Authorization (EUA). This EUA will remain in effect (meaning this test can be used) for the duration of the COVID-19 declaration under Section 564(b)(1) of the Act, 21 U.S.C. section 360bbb-3(b)(1), unless the authorization is terminated or revoked.  Performed at [facility], [address], [place], [place] [address]       Marcellus Scott, MD, FACP, [facility]. [facility]  To contact the attending provider between 7A-7P or the covering provider during after hours 7P-7A, please log into the web site [email or web address] and access using universal [place] password for that web site. If you do not have the password, please call the hospital operator.    07/24/2021, 3:37 PM  LOS: 4 days

## 2021-07-24 NOTE — Plan of Care (Signed)
  Problem: Pain Managment: Goal: General experience of comfort will improve Outcome: Progressing   

## 2021-07-24 NOTE — Progress Notes (Signed)
3 Days Post-Op   Subjective/Chief Complaint: Complains of soreness but otherwise ok   Objective: Vital signs in last 24 hours: Temp:  [98.3 F (36.8 C)-98.6 F (37 C)] 98.6 F (37 C) (08/20 0409) Pulse Rate:  [66-71] 71 (08/20 0409) Resp:  [18] 18 (08/20 0409) BP: (147-205)/(85-95) 205/95 (08/20 0409) SpO2:  [94 %-95 %] 94 % (08/20 0409) Last BM Date: 07/19/21  Intake/Output from previous day: 08/19 0701 - 08/20 0700 In: 2250.9 [I.V.:2109.1; IV Piggyback:141.9] Out: 2000 [Urine:1925; Drains:75] Intake/Output this shift: No intake/output data recorded.  General appearance: alert and cooperative Resp: clear to auscultation bilaterally Cardio: regular rate and rhythm GI: soft, mild tenderness. Drain output serosanguinous  Lab Results:  Recent Labs    07/22/21 0613 07/23/21 0501  WBC 13.2* 8.3  HGB 10.2* 9.5*  HCT 31.4* 29.4*  PLT 260 287   BMET Recent Labs    07/23/21 0501 07/24/21 0501  NA 139 138  K 3.4* 3.9  CL 106 106  CO2 29 26  GLUCOSE 96 115*  BUN 14 11  CREATININE 1.19 1.17  CALCIUM 8.2* 8.7*   PT/INR No results for input(s): LABPROT, INR in the last 72 hours. ABG No results for input(s): PHART, HCO3 in the last 72 hours.  Invalid input(s): PCO2, PO2  Studies/Results: No results found.  Anti-infectives: Anti-infectives (From admission, onward)    Start     Dose/Rate Route Frequency Ordered Stop   07/21/21 1330  piperacillin-tazobactam (ZOSYN) IVPB 3.375 g        3.375 g 12.5 mL/hr over 240 Minutes Intravenous Every 8 hours 07/21/21 1227     07/20/21 2200  ceFEPIme (MAXIPIME) 2 g in sodium chloride 0.9 % 100 mL IVPB  Status:  Discontinued        2 g 200 mL/hr over 30 Minutes Intravenous Every 8 hours 07/20/21 2103 07/21/21 1228   07/20/21 2200  metroNIDAZOLE (FLAGYL) IVPB 500 mg  Status:  Discontinued        500 mg 100 mL/hr over 60 Minutes Intravenous Every 12 hours 07/20/21 2103 07/21/21 1228   07/20/21 1900  piperacillin-tazobactam  (ZOSYN) IVPB 3.375 g        3.375 g 100 mL/hr over 30 Minutes Intravenous  Once 07/20/21 1848 07/20/21 2000       Assessment/Plan: s/p Procedure(s): LAPAROSCOPIC CHOLECSYTECTOMY; DRAINAGE OF HEMOPERITONEUM (N/A) Advance diet Continue drain and abx for 5 days.  POD 4 Uncontrolled hypertension per medicine  LOS: 4 days    Chevis Pretty III 07/24/2021

## 2021-07-25 LAB — COMPREHENSIVE METABOLIC PANEL
ALT: 48 U/L — ABNORMAL HIGH (ref 0–44)
AST: 25 U/L (ref 15–41)
Albumin: 3.4 g/dL — ABNORMAL LOW (ref 3.5–5.0)
Alkaline Phosphatase: 76 U/L (ref 38–126)
Anion gap: 7 (ref 5–15)
BUN: 10 mg/dL (ref 6–20)
CO2: 24 mmol/L (ref 22–32)
Calcium: 9 mg/dL (ref 8.9–10.3)
Chloride: 110 mmol/L (ref 98–111)
Creatinine, Ser: 1.07 mg/dL (ref 0.61–1.24)
GFR, Estimated: >60 mL/min (ref >60)
Glucose, Bld: 120 mg/dL — ABNORMAL HIGH (ref 70–99)
Potassium: 3.9 mmol/L (ref 3.5–5.1)
Sodium: 141 mmol/L (ref 135–145)
Total Bilirubin: 0.6 mg/dL (ref 0.3–1.2)
Total Protein: 6.7 g/dL (ref 6.5–8.1)

## 2021-07-25 LAB — CBC
HCT: 32.3 % — ABNORMAL LOW (ref 39.0–52.0)
Hemoglobin: 10.6 g/dL — ABNORMAL LOW (ref 13.0–17.0)
MCH: 28 pg (ref 26.0–34.0)
MCHC: 32.8 g/dL (ref 30.0–36.0)
MCV: 85.2 fL (ref 80.0–100.0)
Platelets: 380 10*3/uL (ref 150–400)
RBC: 3.79 MIL/uL — ABNORMAL LOW (ref 4.22–5.81)
RDW: 15.7 % — ABNORMAL HIGH (ref 11.5–15.5)
WBC: 7.9 10*3/uL (ref 4.0–10.5)
nRBC: 0 % (ref 0.0–0.2)

## 2021-07-25 LAB — HEMOGLOBIN A1C
Hgb A1c MFr Bld: 5.3 % (ref 4.8–5.6)
Mean Plasma Glucose: 105.41 mg/dL

## 2021-07-25 MED ORDER — AMLODIPINE BESYLATE 10 MG PO TABS
10.0000 mg | ORAL_TABLET | Freq: Every day | ORAL | Status: DC
  Administered 2021-07-25 – 2021-07-27 (×3): 10 mg via ORAL
  Filled 2021-07-25 (×3): qty 1

## 2021-07-25 NOTE — Progress Notes (Signed)
4 Days Post-Op   Subjective/Chief Complaint: No complaints   Objective: Vital signs in last 24 hours: Temp:  [98.4 F (36.9 C)-99.2 F (37.3 C)] 99.2 F (37.3 C) (08/21 0635) Pulse Rate:  [56-72] 66 (08/21 0657) Resp:  [16-20] 20 (08/21 0635) BP: (164-209)/(84-100) 165/91 (08/21 0928) SpO2:  [97 %-99 %] 98 % (08/21 0635) Last BM Date: 07/23/21 (per patient report)  Intake/Output from previous day: 08/20 0701 - 08/21 0700 In: 934.9 [I.V.:934.9] Out: 2585 [Urine:2500; Drains:85] Intake/Output this shift: Total I/O In: -  Out: 1500 [Urine:1500]  General appearance: alert and cooperative Resp: clear to auscultation bilaterally Cardio: regular rate and rhythm GI: soft, mild tenderness. Drain output serosanguinous  Lab Results:  Recent Labs    07/23/21 0501 07/25/21 0834  WBC 8.3 7.9  HGB 9.5* 10.6*  HCT 29.4* 32.3*  PLT 287 380   BMET Recent Labs    07/24/21 0501 07/25/21 0834  NA 138 141  K 3.9 3.9  CL 106 110  CO2 26 24  GLUCOSE 115* 120*  BUN 11 10  CREATININE 1.17 1.07  CALCIUM 8.7* 9.0   PT/INR No results for input(s): LABPROT, INR in the last 72 hours. ABG No results for input(s): PHART, HCO3 in the last 72 hours.  Invalid input(s): PCO2, PO2  Studies/Results: No results found.  Anti-infectives: Anti-infectives (From admission, onward)    Start     Dose/Rate Route Frequency Ordered Stop   07/21/21 1330  piperacillin-tazobactam (ZOSYN) IVPB 3.375 g        3.375 g 12.5 mL/hr over 240 Minutes Intravenous Every 8 hours 07/21/21 1227     07/20/21 2200  ceFEPIme (MAXIPIME) 2 g in sodium chloride 0.9 % 100 mL IVPB  Status:  Discontinued        2 g 200 mL/hr over 30 Minutes Intravenous Every 8 hours 07/20/21 2103 07/21/21 1228   07/20/21 2200  metroNIDAZOLE (FLAGYL) IVPB 500 mg  Status:  Discontinued        500 mg 100 mL/hr over 60 Minutes Intravenous Every 12 hours 07/20/21 2103 07/21/21 1228   07/20/21 1900  piperacillin-tazobactam (ZOSYN)  IVPB 3.375 g        3.375 g 100 mL/hr over 30 Minutes Intravenous  Once 07/20/21 1848 07/20/21 2000       Assessment/Plan: s/p Procedure(s): LAPAROSCOPIC CHOLECSYTECTOMY; DRAINAGE OF HEMOPERITONEUM (N/A) Advance diet POD 5 Will likely remove drain prior to d/c Uncontrolled hypertension per medicine  LOS: 5 days    Chevis Pretty III 07/25/2021

## 2021-07-25 NOTE — Plan of Care (Signed)
  Problem: Education: Goal: Knowledge of General Education information will improve Description: Including pain rating scale, medication(s)/side effects and non-pharmacologic comfort measures 07/25/2021 2358 by Llana Aliment D, RN Outcome: Progressing 07/25/2021 2358 by Llana Aliment D, RN Outcome: Progressing   Problem: Activity: Goal: Risk for activity intolerance will decrease 07/25/2021 2358 by Llana Aliment D, RN Outcome: Progressing 07/25/2021 2358 by Llana Aliment D, RN Outcome: Progressing   Problem: Nutrition: Goal: Adequate nutrition will be maintained 07/25/2021 2358 by Llana Aliment D, RN Outcome: Progressing 07/25/2021 2358 by Llana Aliment D, RN Outcome: Progressing   Problem: Elimination: Goal: Will not experience complications related to bowel motility 07/25/2021 2358 by Annitta Jersey, RN Outcome: Progressing 07/25/2021 2358 by Llana Aliment D, RN Outcome: Progressing Goal: Will not experience complications related to urinary retention 07/25/2021 2358 by Annitta Jersey, RN Outcome: Progressing 07/25/2021 2358 by Llana Aliment D, RN Outcome: Progressing   Problem: Pain Managment: Goal: General experience of comfort will improve 07/25/2021 2358 by Annitta Jersey, RN Outcome: Progressing 07/25/2021 2358 by Llana Aliment D, RN Outcome: Progressing   Problem: Safety: Goal: Ability to remain free from injury will improve 07/25/2021 2358 by Annitta Jersey, RN Outcome: Progressing 07/25/2021 2358 by Llana Aliment D, RN Outcome: Progressing   Problem: Skin Integrity: Goal: Risk for impaired skin integrity will decrease 07/25/2021 2358 by Llana Aliment D, RN Outcome: Progressing 07/25/2021 2358 by Annitta Jersey, RN Outcome: Progressing

## 2021-07-25 NOTE — Progress Notes (Signed)
[facility]  PROGRESS NOTE  Eugene Brooks FBP:[id number] DOB: [birth year] DOA: 07/20/2021 PCP: Pcp, No   Brief HPI:   50 year old male with no significant medical history presented with abdominal pain in the right upper quadrant.  CT scan of the abdomen/pelvis showed acute perforated gallbladder disease with gallstones.  Surgery was consulted. Patient also found to have elevated blood pressure in the ED.   Subjective   Patient seen and examined, denies pain.  Blood pressure was elevated yesterday.  Dose of Coreg was increased to 25 mg p.o. twice daily.   Assessment/Plan:    Perforated gallbladder/hemoperitoneum -Secondary to acute necrotic calculus cholecystitis -Underwent laparoscopic cholecystectomy per general surgery -Management per general surgery  Hypertensive urgency -No previous history of hypertension -Presented with SBP 240 mmHg -Initially improved but blood pressure again went up yesterday. -Dose of Coreg was changed, hypertensive urgency has resolved.  Hypertension -Blood pressure labile -Initially started on Coreg 12.5 mg p.o. twice daily, dose of Coreg changed to 25 mg p.o. twice daily yesterday  -Also started on amlodipine 5 mg daily  -We will increase amlodipine to 10 mg daily  -Continue as needed hydralazine   Hyperglycemia -Resolved -Hemoglobin A1c is 5.3  Hypokalemia -Replete  Scheduled medications:      Data Reviewed:   CBG:  No results for input(s): GLUCAP in the last 168 hours.  SpO2: 95 % O2 Flow Rate (L/min): 2 L/min    Vitals:   07/25/21 0657 07/25/21 0928 07/25/21 1254 07/25/21 1320  BP: (!) 197/98 (!) 165/91 (!) 159/81 (!) 146/84  Pulse: 66   71  Resp:    18  Temp:    98.5 F (36.9 C)  TempSrc:      SpO2:    95%  Weight:      Height:         Intake/Output Summary (Last 24 hours) at 07/25/2021 1459 Last data filed at 07/25/2021 1300 Gross per 24 hour  Intake 2614.04 ml  Output 4135 ml  Net -1520.96 ml    08/19  1901 - 08/21 0700 In: 3185.8 [I.V.:3043.9] Out: 3170 [Urine:3050; Drains:120]  Filed Weights   07/21/21 0234  Weight: 83.9 kg    CBC:  Recent Labs  Lab 07/21/21 0345 07/21/21 1227 07/22/21 0613 07/23/21 0501 07/25/21 0834  WBC 12.3* 11.0* 13.2* 8.3 7.9  HGB 12.8* 12.4* 10.2* 9.5* 10.6*  HCT 38.7* 37.5* 31.4* 29.4* 32.3*  PLT 328 307 260 287 380  MCV 84.7 86.0 87.7 88.0 85.2  MCH 28.0 28.4 28.5 28.4 28.0  MCHC 33.1 33.1 32.5 32.3 32.8  RDW 15.9* 15.8* 15.9* 15.9* 15.7*    Complete metabolic panel:  Recent Labs  Lab 07/20/21 1705 07/21/21 0345 07/21/21 1227 07/22/21 0613 07/23/21 0501 07/24/21 0501 07/25/21 0834  NA 136 135 136  --  139 138 141  K 3.7 4.1 3.8 3.5 3.4* 3.9 3.9  CL 98 102 99  --  106 106 110  CO2 24 23 28   --  29 26 24   GLUCOSE 157* 120* 134*  --  96 115* 120*  BUN 19 16 17   --  14 11 10   CREATININE 1.20 1.02 0.98 1.07 1.19 1.17 1.07  CALCIUM 8.6* 8.6* 8.6*  --  8.2* 8.7* 9.0  AST 25 22  --  114* 68*  --  25  ALT 29 23  --  89* 78*  --  48*  ALKPHOS 85 74  --  65 63  --  76  BILITOT 1.3* 1.4*  --  2.4* 1.1  --  0.6  ALBUMIN 4.5 4.2  --  3.4* 3.0*  --  3.4*  MG  --   --   --  1.8 1.9 2.0  --   HGBA1C  --   --   --   --   --   --  5.3    Recent Labs  Lab 07/20/21 1705  LIPASE 25    Recent Labs  Lab 07/20/21 1848  SARSCOV2NAA NEGATIVE    ------------------------------------------------------------------------------------------------------------------ No results for input(s): CHOL, HDL, LDLCALC, TRIG, CHOLHDL, LDLDIRECT in the last 72 hours.  Lab Results  Component Value Date   HGBA1C 5.3 07/25/2021   ------------------------------------------------------------------------------------------------------------------ No results for input(s): TSH, T4TOTAL, T3FREE, THYROIDAB in the last 72 hours.  Invalid input(s):  FREET3 ------------------------------------------------------------------------------------------------------------------ No results for input(s): VITAMINB12, FOLATE, FERRITIN, TIBC, IRON, RETICCTPCT in the last 72 hours.  Coagulation profile No results for input(s): INR, PROTIME in the last 168 hours. No results for input(s): DDIMER in the last 72 hours.  Cardiac Enzymes No results for input(s): CKTOTAL, CKMB, CKMBINDEX, TROPONINI in the last 168 hours.  ------------------------------------------------------------------------------------------------------------------ No results found for: BNP   Antibiotics: Anti-infectives (From admission, onward)    Start     Dose/Rate Route Frequency Ordered Stop   07/21/21 1330  piperacillin-tazobactam (ZOSYN) IVPB 3.375 g        3.375 g 12.5 mL/hr over 240 Minutes Intravenous Every 8 hours 07/21/21 1227     07/20/21 2200  ceFEPIme (MAXIPIME) 2 g in sodium chloride 0.9 % 100 mL IVPB  Status:  Discontinued        2 g 200 mL/hr over 30 Minutes Intravenous Every 8 hours 07/20/21 2103 07/21/21 1228   07/20/21 2200  metroNIDAZOLE (FLAGYL) IVPB 500 mg  Status:  Discontinued        500 mg 100 mL/hr over 60 Minutes Intravenous Every 12 hours 07/20/21 2103 07/21/21 1228   07/20/21 1900  piperacillin-tazobactam (ZOSYN) IVPB 3.375 g        3.375 g 100 mL/hr over 30 Minutes Intravenous  Once 07/20/21 1848 07/20/21 2000        Radiology Reports  No results found.    DVT prophylaxis: SCDs  Family Communication: No family at bedside   Consultants: General surgery  Procedures: None    Objective    Physical Examination:  General-appears in no acute distress Heart-S1-S2, regular, no murmur auscultated Lungs-clear to auscultation bilaterally, no wheezing or crackles auscultated Abdomen-soft, nontender, no organomegaly Extremities-no edema in the lower extremities Neuro-alert, oriented x3, no focal deficit noted  Status is:  Inpatient  Dispo: The patient is from: Home              Anticipated d/c is to: Home              Anticipated d/c date is: 07/26/2021              Patient currently not stable for discharge  Barrier to discharge-will require surgery for perforated gallbladder  COVID-19 Labs  No results for input(s): DDIMER, FERRITIN, LDH, CRP in the last 72 hours.  Lab Results  Component Value Date   SARSCOV2NAA NEGATIVE 07/20/2021    Microbiology  Recent Results (from the past 240 hour(s))  Resp Panel by RT-PCR (Flu A&B, Covid) Nasopharyngeal Swab     Status: None   Collection Time: 07/20/21  6:48 PM   Specimen: Nasopharyngeal Swab; Nasopharyngeal(NP) swabs in vial transport medium  Result Value Ref Range Status   SARS Coronavirus  2 by RT PCR NEGATIVE NEGATIVE Final    Comment: (NOTE) SARS-CoV-2 target nucleic acids are NOT DETECTED.  The SARS-CoV-2 RNA is generally detectable in upper respiratory specimens during the acute phase of infection. The lowest concentration of SARS-CoV-2 viral copies this assay can detect is 138 copies/mL. A negative result does not preclude SARS-Cov-2 infection and should not be used as the sole basis for treatment or other patient management decisions. A negative result may occur with  improper specimen collection/handling, submission of specimen other than nasopharyngeal swab, presence of viral mutation(s) within the areas targeted by this assay, and inadequate number of viral copies(<138 copies/mL). A negative result must be combined with clinical observations, patient history, and epidemiological information. The expected result is Negative.  Fact Sheet for Patients:  BloggerCourse.com  Fact Sheet for Healthcare Providers:  SeriousBroker.it  This test is no t yet approved or cleared by the [place] FDA and  has been authorized for detection and/or diagnosis of SARS-CoV-2 by FDA under an Emergency  Use Authorization (EUA). This EUA will remain  in effect (meaning this test can be used) for the duration of the COVID-19 declaration under Section 564(b)(1) of the Act, 21 U.S.C.section 360bbb-3(b)(1), unless the authorization is terminated  or revoked sooner.       Influenza A by PCR NEGATIVE NEGATIVE Final   Influenza B by PCR NEGATIVE NEGATIVE Final    Comment: (NOTE) The Xpert Xpress SARS-CoV-2/FLU/RSV plus assay is intended as an aid in the diagnosis of influenza from Nasopharyngeal swab specimens and should not be used as a sole basis for treatment. Nasal washings and aspirates are unacceptable for Xpert Xpress SARS-CoV-2/FLU/RSV testing.  Fact Sheet for Patients: BloggerCourse.com  Fact Sheet for Healthcare Providers: SeriousBroker.it  This test is not yet approved or cleared by the [place] FDA and has been authorized for detection and/or diagnosis of SARS-CoV-2 by FDA under an Emergency Use Authorization (EUA). This EUA will remain in effect (meaning this test can be used) for the duration of the COVID-19 declaration under Section 564(b)(1) of the Act, 21 U.S.C. section 360bbb-3(b)(1), unless the authorization is terminated or revoked.  Performed at [facility], [address] [place], [place] [address]         Meredeth Ide   [facility] If 7PM-7AM, please contact night-coverage at [email or web address], Office  [phone]   07/25/2021, 2:59 PM  LOS: 5 days

## 2021-07-25 NOTE — Plan of Care (Signed)
  Problem: Education: Goal: Knowledge of General Education information will improve Description: Including pain rating scale, medication(s)/side effects and non-pharmacologic comfort measures Outcome: Progressing   Problem: Activity: Goal: Risk for activity intolerance will decrease Outcome: Progressing   Problem: Nutrition: Goal: Adequate nutrition will be maintained Outcome: Progressing   Problem: Elimination: Goal: Will not experience complications related to bowel motility Outcome: Progressing Goal: Will not experience complications related to urinary retention Outcome: Progressing   Problem: Pain Managment: Goal: General experience of comfort will improve Outcome: Progressing   

## 2021-07-26 MED ORDER — ACETAMINOPHEN 500 MG PO TABS: 1000.0000 mg | ORAL_TABLET | Freq: Four times a day (QID) | ORAL | 0 refills | Status: AC

## 2021-07-26 MED ORDER — TRAMADOL HCL 50 MG PO TABS: 50.0000 mg | ORAL_TABLET | Freq: Four times a day (QID) | ORAL | 0 refills | Status: AC | PRN

## 2021-07-26 MED ORDER — CARVEDILOL 25 MG PO TABS: 25.0000 mg | ORAL_TABLET | Freq: Every day | ORAL | 2 refills | Status: DC

## 2021-07-26 MED ORDER — AMLODIPINE BESYLATE 10 MG PO TABS: 10.0000 mg | ORAL_TABLET | Freq: Every day | ORAL | 3 refills | Status: AC
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
## 2021-07-26 NOTE — Progress Notes (Signed)
Mobility Specialist - Progress Note    07/26/21 1554  Mobility  Activity Ambulated in hall  Level of Assistance Modified independent, requires aide device or extra time  Assistive Device None  Distance Ambulated (ft) 500 ft  Mobility Ambulated independently in hallway  Mobility Response Tolerated well  Mobility performed by Mobility specialist  $Mobility charge 1 Mobility   Pt adamant about not needing mobility services. Pt was agreeable to ambulate 500 ft in hallway with no assistive device. Pt was swaying back and forth during ambulation and c/o of SOB, stating he felt "heavy". Pt was returned to room after session and left with call bell at side and food tray in front of him.   Arliss Journey Mobility Specialist Acute Rehabilitation Services Phone: [phone] 07/26/21, 4:03 PM

## 2021-07-26 NOTE — Progress Notes (Signed)
[facility]  PROGRESS NOTE  Eugene Brooks FYB:[id number] DOB: 05/08/1971 DOA: 07/20/2021 PCP: Pcp, No   Brief HPI:   50 year old male with no significant medical history presented with abdominal pain in the right upper quadrant.  CT scan of the abdomen/pelvis showed acute perforated gallbladder disease with gallstones.  Surgery was consulted. Patient also found to have elevated blood pressure in the ED.   Subjective   BP better controlled. Denies pain. Eating soft diet.   Assessment/Plan:   Patient discharged today, see details in discharge summary  Scheduled medications:      Data Reviewed:   CBG:  No results for input(s): GLUCAP in the last 168 hours.  SpO2: 95 % O2 Flow Rate (L/min): 2 L/min    Vitals:   07/25/21 1817 07/25/21 1856 07/25/21 2050 07/26/21 0536  BP: (!) 171/104 (!) 158/76 (!) 168/93 (!) 155/73  Pulse: 68 82 79 66  Resp:   17 16  Temp:   99.2 F (37.3 C) 98.6 F (37 C)  TempSrc:   Oral Oral  SpO2:   97% 95%  Weight:      Height:         Intake/Output Summary (Last 24 hours) at 07/26/2021 0911 Last data filed at 07/25/2021 2104 Gross per 24 hour  Intake 1799.16 ml  Output 1655 ml  Net 144.16 ml    08/20 1901 - 08/22 0700 In: 1799.2 [P.O.:120; I.V.:1679.2] Out: 4495 [Urine:4425; Drains:70]  Filed Weights   07/21/21 0234  Weight: 83.9 kg    CBC:  Recent Labs  Lab 07/21/21 0345 07/21/21 1227 07/22/21 0613 07/23/21 0501 07/25/21 0834  WBC 12.3* 11.0* 13.2* 8.3 7.9  HGB 12.8* 12.4* 10.2* 9.5* 10.6*  HCT 38.7* 37.5* 31.4* 29.4* 32.3*  PLT 328 307 260 287 380  MCV 84.7 86.0 87.7 88.0 85.2  MCH 28.0 28.4 28.5 28.4 28.0  MCHC 33.1 33.1 32.5 32.3 32.8  RDW 15.9* 15.8* 15.9* 15.9* 15.7*    Complete metabolic panel:  Recent Labs  Lab 07/20/21 1705 07/21/21 0345 07/21/21 1227 07/22/21 0613 07/23/21 0501 07/24/21 0501 07/25/21 0834  NA 136 135 136  --  139 138 141  K 3.7 4.1 3.8 3.5 3.4* 3.9 3.9  CL 98 102 99  --   106 106 110  CO2 24 23 28   --  29 26 24   GLUCOSE 157* 120* 134*  --  96 115* 120*  BUN 19 16 17   --  14 11 10   CREATININE 1.20 1.02 0.98 1.07 1.19 1.17 1.07  CALCIUM 8.6* 8.6* 8.6*  --  8.2* 8.7* 9.0  AST 25 22  --  114* 68*  --  25  ALT 29 23  --  89* 78*  --  48*  ALKPHOS 85 74  --  65 63  --  76  BILITOT 1.3* 1.4*  --  2.4* 1.1  --  0.6  ALBUMIN 4.5 4.2  --  3.4* 3.0*  --  3.4*  MG  --   --   --  1.8 1.9 2.0  --   HGBA1C  --   --   --   --   --   --  5.3    Recent Labs  Lab 07/20/21 1705  LIPASE 25    Recent Labs  Lab 07/20/21 1848  SARSCOV2NAA NEGATIVE    ------------------------------------------------------------------------------------------------------------------ No results for input(s): CHOL, HDL, LDLCALC, TRIG, CHOLHDL, LDLDIRECT in the last 72 hours.  Lab Results  Component Value Date   HGBA1C  5.3 07/25/2021   ------------------------------------------------------------------------------------------------------------------ No results for input(s): TSH, T4TOTAL, T3FREE, THYROIDAB in the last 72 hours.  Invalid input(s): FREET3 ------------------------------------------------------------------------------------------------------------------ No results for input(s): VITAMINB12, FOLATE, FERRITIN, TIBC, IRON, RETICCTPCT in the last 72 hours.  Coagulation profile No results for input(s): INR, PROTIME in the last 168 hours. No results for input(s): DDIMER in the last 72 hours.  Cardiac Enzymes No results for input(s): CKTOTAL, CKMB, CKMBINDEX, TROPONINI in the last 168 hours.  ------------------------------------------------------------------------------------------------------------------ No results found for: BNP   Antibiotics: Anti-infectives (From admission, onward)    Start     Dose/Rate Route Frequency Ordered Stop   07/21/21 1330  piperacillin-tazobactam (ZOSYN) IVPB 3.375 g        3.375 g 12.5 mL/hr over 240 Minutes Intravenous Every 8 hours  07/21/21 1227     07/20/21 2200  ceFEPIme (MAXIPIME) 2 g in sodium chloride 0.9 % 100 mL IVPB  Status:  Discontinued        2 g 200 mL/hr over 30 Minutes Intravenous Every 8 hours 07/20/21 2103 07/21/21 1228   07/20/21 2200  metroNIDAZOLE (FLAGYL) IVPB 500 mg  Status:  Discontinued        500 mg 100 mL/hr over 60 Minutes Intravenous Every 12 hours 07/20/21 2103 07/21/21 1228   07/20/21 1900  piperacillin-tazobactam (ZOSYN) IVPB 3.375 g        3.375 g 100 mL/hr over 30 Minutes Intravenous  Once 07/20/21 1848 07/20/21 2000        Radiology Reports  No results found.    DVT prophylaxis: SCDs  Family Communication: No family at bedside   Consultants: General surgery  Procedures: None    Objective    Physical Examination:  General-appears in no acute distress Heart-S1-S2, regular, no murmur auscultated Lungs-clear to auscultation bilaterally, no wheezing or crackles auscultated Abdomen-soft, nontender, no organomegaly Extremities-no edema in the lower extremities Neuro-alert, oriented x3, no focal deficit noted  Status is: Inpatient  Dispo: The patient is from: Home              Anticipated d/c is to: Home              Anticipated d/c date is: 07/26/2021                Barrier to discharge-will require surgery for perforated gallbladder  COVID-19 Labs  No results for input(s): DDIMER, FERRITIN, LDH, CRP in the last 72 hours.  Lab Results  Component Value Date   SARSCOV2NAA NEGATIVE 07/20/2021    Microbiology  Recent Results (from the past 240 hour(s))  Resp Panel by RT-PCR (Flu A&B, Covid) Nasopharyngeal Swab     Status: None   Collection Time: 07/20/21  6:48 PM   Specimen: Nasopharyngeal Swab; Nasopharyngeal(NP) swabs in vial transport medium  Result Value Ref Range Status   SARS Coronavirus 2 by RT PCR NEGATIVE NEGATIVE Final    Comment: (NOTE) SARS-CoV-2 target nucleic acids are NOT DETECTED.  The SARS-CoV-2 RNA is generally detectable in upper  respiratory specimens during the acute phase of infection. The lowest concentration of SARS-CoV-2 viral copies this assay can detect is 138 copies/mL. A negative result does not preclude SARS-Cov-2 infection and should not be used as the sole basis for treatment or other patient management decisions. A negative result may occur with  improper specimen collection/handling, submission of specimen other than nasopharyngeal swab, presence of viral mutation(s) within the areas targeted by this assay, and inadequate number of viral copies(<138 copies/mL). A negative result must be combined  with clinical observations, patient history, and epidemiological information. The expected result is Negative.  Fact Sheet for Patients:  BloggerCourse.com  Fact Sheet for Healthcare Providers:  SeriousBroker.it  This test is no t yet approved or cleared by the [place] FDA and  has been authorized for detection and/or diagnosis of SARS-CoV-2 by FDA under an Emergency Use Authorization (EUA). This EUA will remain  in effect (meaning this test can be used) for the duration of the COVID-19 declaration under Section 564(b)(1) of the Act, 21 U.S.C.section 360bbb-3(b)(1), unless the authorization is terminated  or revoked sooner.       Influenza A by PCR NEGATIVE NEGATIVE Final   Influenza B by PCR NEGATIVE NEGATIVE Final    Comment: (NOTE) The Xpert Xpress SARS-CoV-2/FLU/RSV plus assay is intended as an aid in the diagnosis of influenza from Nasopharyngeal swab specimens and should not be used as a sole basis for treatment. Nasal washings and aspirates are unacceptable for Xpert Xpress SARS-CoV-2/FLU/RSV testing.  Fact Sheet for Patients: BloggerCourse.com  Fact Sheet for Healthcare Providers: SeriousBroker.it  This test is not yet approved or cleared by the [place] FDA and has been  authorized for detection and/or diagnosis of SARS-CoV-2 by FDA under an Emergency Use Authorization (EUA). This EUA will remain in effect (meaning this test can be used) for the duration of the COVID-19 declaration under Section 564(b)(1) of the Act, 21 U.S.C. section 360bbb-3(b)(1), unless the authorization is terminated or revoked.  Performed at [facility], [address], [place], [place] [address]         Meredeth Ide   [facility] If 7PM-7AM, please contact night-coverage at [email or web address], Office  [phone]   07/26/2021, 9:11 AM  LOS: 6 days

## 2021-07-27 MED ORDER — CARVEDILOL 25 MG PO TABS: 25.0000 mg | ORAL_TABLET | Freq: Two times a day (BID) | ORAL | 2 refills | Status: AC
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
## 2021-07-27 NOTE — TOC Transition Note (Addendum)
Transition of Care [facility]) - CM/SW Discharge Note   Patient Details  Name: Chadley Dziedzic MRN: [id number] Date of Birth: 1971-05-16  Transition of Care [facility]) CM/SW Contact:  Lanier Clam, RN Phone Number: 07/27/2021, 10:44 AM   Clinical Narrative: d/c today No further CM needs. Refer to prior note on 8/19.   2:54p-Per nsg patient has own transport home. No further CM needs.    Final next level of care: Home/Self Care Barriers to Discharge: No Barriers Identified   Patient Goals and CMS Choice Patient states their goals for this hospitalization and ongoing recovery are:: go home CMS Medicare.gov Compare Post Acute Care list provided to:: Patient    Discharge Placement                       Discharge Plan and Services   Discharge Planning Services: CM Consult                                 Social Determinants of Health (SDOH) Interventions Food Insecurity Interventions: Intervention Not Indicated Financial Strain Interventions: Intervention Not Indicated Housing Interventions: Intervention Not Indicated Intimate Partner Violence Interventions: Intervention Not Indicated Physical Activity Interventions: Intervention Not Indicated Social Connections Interventions: Intervention Not Indicated Transportation Interventions: Intervention Not Indicated   Readmission Risk Interventions No flowsheet data found.

## 2021-07-27 NOTE — Progress Notes (Signed)
Provided and discussed discharge instructions. Addressed all questions and concerns. IV removed intact.  ?Eugene Brooks N [name] ? ?

## 2021-07-27 NOTE — Plan of Care (Signed)

## 2022-11-04 DEATH — deceased
